# Patient Record
Sex: Female | Born: 1995 | Race: Black or African American | Hispanic: No | Marital: Single | Smoking: Never smoker
Health system: Southern US, Community
[De-identification: ages and names within clinical notes are randomized; demographics above are authoritative.]

## PROBLEM LIST (undated history)

## (undated) DIAGNOSIS — Z973 Presence of spectacles and contact lenses: Secondary | ICD-10-CM

## (undated) DIAGNOSIS — G43909 Migraine, unspecified, not intractable, without status migrainosus: Secondary | ICD-10-CM

## (undated) DIAGNOSIS — Z148 Genetic carrier of other disease: Secondary | ICD-10-CM

## (undated) DIAGNOSIS — I10 Essential (primary) hypertension: Secondary | ICD-10-CM

## (undated) HISTORY — PX: WISDOM TOOTH EXTRACTION: SHX21

## (undated) HISTORY — PX: NO PAST SURGERIES: SHX2092

---
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
## 2014-11-01 ENCOUNTER — Encounter (HOSPITAL_COMMUNITY): Payer: Self-pay | Admitting: Emergency Medicine

## 2014-11-01 ENCOUNTER — Emergency Department (HOSPITAL_COMMUNITY)
Admission: EM | Admit: 2014-11-01 | Discharge: 2014-11-02 | Disposition: A | Discharge status: Home / Self Care | Payer: BLUE CROSS/BLUE SHIELD | Attending: Emergency Medicine | Admitting: Emergency Medicine

## 2014-11-01 DIAGNOSIS — J029 Acute pharyngitis, unspecified: Secondary | ICD-10-CM | POA: Diagnosis present

## 2014-11-01 DIAGNOSIS — J020 Streptococcal pharyngitis: Secondary | ICD-10-CM | POA: Insufficient documentation

## 2014-11-01 LAB — RAPID STREP SCREEN (MED CTR MEBANE ONLY): Streptococcus, Group A Screen (Direct): POSITIVE — AB

## 2014-11-01 NOTE — ED Notes (Signed)
Pt reports sore throat and body aches since last night. Tonsils are swollen. Pt has hx strep throat. No other c/c.

## 2014-11-02 MED ORDER — AMOXICILLIN 500 MG PO CAPS: 1000.0000 mg | ORAL_CAPSULE | Freq: Two times a day (BID) | ORAL | Status: DC

## 2014-11-02 NOTE — ED Provider Notes (Signed)
CSN: [id number]     Arrival date & time 11/01/14  2300 History   First MD Initiated Contact with Patient 11/01/14 2331     Chief Complaint  Patient presents with  . Sore Throat  . Generalized Body Aches     (Consider location/radiation/quality/duration/timing/severity/associated sxs/prior Treatment) Patient is a 19 y.o. female presenting with pharyngitis. The history is provided by the patient. No language interpreter was used.  Sore Throat This is a new problem. The current episode started yesterday. The problem occurs constantly. The problem has been gradually worsening. Associated symptoms include chills, congestion, a fever and a sore throat. Pertinent negatives include no coughing, nausea, rash, vomiting or weakness.    History reviewed. No pertinent past medical history. History reviewed. No pertinent past surgical history. History reviewed. No pertinent family history. Social History  Substance Use Topics  . Smoking status: Never Smoker   . Smokeless tobacco: None  . Alcohol Use: No   OB History    No data available     Review of Systems  Constitutional: Positive for fever and chills.  HENT: Positive for congestion and sore throat. Negative for trouble swallowing.   Respiratory: Negative for cough.   Gastrointestinal: Negative for nausea and vomiting.  Skin: Negative for rash.  Neurological: Negative for weakness.      Allergies  Review of patient's allergies indicates no known allergies.  Home Medications   Prior to Admission medications   Medication Sig Start Date End Date Taking? Authorizing Provider  amoxicillin (AMOXIL) 500 MG capsule Take 2 capsules (1,000 mg total) by mouth 2 (two) times daily. 11/02/14   Shari Upstill, PA-C   BP 145/76 mmHg  Pulse 123  Temp(Src) 99.5 F (37.5 C) (Oral)  Resp 15  Ht  (1.6 m)  Wt 165 lb (74.844 kg)  BMI 29.24 kg/m2  SpO2 100%  LMP 10/11/2014 (Exact Date) Physical Exam  Constitutional: She is oriented to  person, place, and time. She appears well-developed and well-nourished.  HENT:  Head: Normocephalic.  Mouth/Throat: Oropharynx is clear and moist.  Bilateral tonsillar swelling without exudates. Uvula midline.   Neck: Normal range of motion. Neck supple.  Pulmonary/Chest: Effort normal. No respiratory distress.  Abdominal: Soft. Bowel sounds are normal. There is no tenderness. There is no rebound and no guarding.  Musculoskeletal: Normal range of motion.  Lymphadenopathy:    She has cervical adenopathy.  Neurological: She is alert and oriented to person, place, and time.  Skin: Skin is warm and dry. No rash noted.  Psychiatric: She has a normal mood and affect.    ED Course  Procedures (including critical care time) Labs Review Labs Reviewed  RAPID STREP SCREEN (NOT AT [facility]) - Abnormal; Notable for the following:    Streptococcus, Group A Screen (Direct) POSITIVE (*)    All other components within normal limits    Imaging Review No results found. I have personally reviewed and evaluated these images and lab results as part of my medical decision-making.   EKG Interpretation None      MDM   Final diagnoses:  Strep throat    Uncomplicated strep throat in well appearing patient. She prefers oral abx regimen vs bicillin IM.    Elpidio Anis, PA-C 11/02/14 8119  Devoria Albe, MD 11/02/14 [phone]

## 2014-11-02 NOTE — Discharge Instructions (Signed)
Salt Water Gargle This solution will help make your mouth and throat feel better. HOME CARE INSTRUCTIONS   Mix 1 teaspoon of salt in 8 ounces of warm water.  Gargle with this solution as much or often as you need or as directed. Swish and gargle gently if you have any sores or wounds in your mouth.  Do not swallow this mixture. Document Released: 11/02/2003 Document Revised: 04/22/2011 Document Reviewed: 03/25/2008 [facility] Patient Information 2015 [place], [place]. This information is not intended to replace advice given to you by your health care provider. Make sure you discuss any questions you have with your health care provider. Strep Throat Strep throat is an infection of the throat caused by a bacteria named Streptococcus pyogenes. Your health care provider may call the infection streptococcal "tonsillitis" or "pharyngitis" depending on whether there are signs of inflammation in the tonsils or back of the throat. Strep throat is most common in children aged 5-15 years during the cold months of the year, but it can occur in people of any age during any season. This infection is spread from person to person (contagious) through coughing, sneezing, or other close contact. SIGNS AND SYMPTOMS   Fever or chills.  Painful, swollen, red tonsils or throat.  Pain or difficulty when swallowing.  White or yellow spots on the tonsils or throat.  Swollen, tender lymph nodes or "glands" of the neck or under the jaw.  Red rash all over the body (rare). DIAGNOSIS  Many different infections can cause the same symptoms. A test must be done to confirm the diagnosis so the right treatment can be given. A "rapid strep test" can help your health care provider make the diagnosis in a few minutes. If this test is not available, a light swab of the infected area can be used for a throat culture test. If a throat culture test is done, results are usually available in a day or two. TREATMENT  Strep throat is  treated with antibiotic medicine. HOME CARE INSTRUCTIONS   Gargle with 1 tsp of salt in 1 cup of warm water, 3-4 times per day or as needed for comfort.  Family members who also have a sore throat or fever should be tested for strep throat and treated with antibiotics if they have the strep infection.  Make sure everyone in your household washes their hands well.  Do not share food, drinking cups, or personal items that could cause the infection to spread to others.  You may need to eat a soft food diet until your sore throat gets better.  Drink enough water and fluids to keep your urine clear or pale yellow. This will help prevent dehydration.  Get plenty of rest.  Stay home from school, day care, or work until you have been on antibiotics for 24 hours.  Take medicines only as directed by your health care provider.  Take your antibiotic medicine as directed by your health care provider. Finish it even if you start to feel better. SEEK MEDICAL CARE IF:   The glands in your neck continue to enlarge.  You develop a rash, cough, or earache.  You cough up green, yellow-Boomershine, or bloody sputum.  You have pain or discomfort not controlled by medicines.  Your problems seem to be getting worse rather than better.  You have a fever. SEEK IMMEDIATE MEDICAL CARE IF:   You develop any new symptoms such as vomiting, severe headache, stiff or painful neck, chest pain, shortness of breath, or trouble  swallowing. °· You develop severe throat pain, drooling, or changes in your voice. °· You develop swelling of the neck, or the skin on the neck becomes red and tender. °· You develop signs of dehydration, such as fatigue, dry mouth, and decreased urination. °· You become increasingly sleepy, or you cannot wake up completely. °MAKE SURE YOU: °· Understand these instructions. °· Will watch your condition. °· Will get help right away if you are not doing well or get worse. °Document Released:  01/26/2000 Document Revised: 06/14/2013 Document Reviewed: 03/29/2010 °ExitCare® Patient Information ©2015 [employer], LLC. This information is not intended to replace advice given to you by your health care provider. Make sure you discuss any questions you have with your health care provider. ° °

## 2016-03-13 ENCOUNTER — Encounter (HOSPITAL_COMMUNITY): Payer: Self-pay

## 2016-03-13 ENCOUNTER — Emergency Department (HOSPITAL_COMMUNITY)
Admission: EM | Admit: 2016-03-13 | Discharge: 2016-03-13 | Disposition: A | Discharge status: Home / Self Care | Payer: PRIVATE HEALTH INSURANCE | Attending: Emergency Medicine | Admitting: Emergency Medicine

## 2016-03-13 DIAGNOSIS — G43009 Migraine without aura, not intractable, without status migrainosus: Secondary | ICD-10-CM | POA: Insufficient documentation

## 2016-03-13 DIAGNOSIS — G43909 Migraine, unspecified, not intractable, without status migrainosus: Secondary | ICD-10-CM | POA: Diagnosis present

## 2016-03-13 MED ORDER — IBUPROFEN 400 MG PO TABS: 600.0000 mg | ORAL_TABLET | Freq: Once | ORAL | Status: DC

## 2016-03-13 MED ORDER — BUTALBITAL-APAP-CAFFEINE 50-325-40 MG PO TABS: 1.0000 | ORAL_TABLET | Freq: Four times a day (QID) | ORAL | 0 refills | Status: AC | PRN

## 2016-03-13 MED ORDER — METOCLOPRAMIDE HCL 10 MG PO TABS
10.0000 mg | ORAL_TABLET | Freq: Once | ORAL | Status: AC
  Administered 2016-03-13: 10 mg via ORAL
  Filled 2016-03-13: qty 1

## 2016-03-13 MED ORDER — BUTALBITAL-APAP-CAFFEINE 50-325-40 MG PO TABS
1.0000 | ORAL_TABLET | Freq: Once | ORAL | Status: AC
  Administered 2016-03-13: 1 via ORAL
  Filled 2016-03-13: qty 1

## 2016-03-13 MED ORDER — METOCLOPRAMIDE HCL 10 MG PO TABS: 10.0000 mg | ORAL_TABLET | Freq: Four times a day (QID) | ORAL | 0 refills | Status: DC | PRN

## 2016-03-13 NOTE — ED Notes (Signed)
Pt verbalized understanding of d/c instructions and has no further questions. Pt stable and NAD.  

## 2016-03-13 NOTE — ED Triage Notes (Signed)
Pt presents with onset of frontal headache that began this morning.  Pt reports h/o migraines, reports she drank caffeine that resolved pain.  She reports eating pork and beef initiate pain. +photophobia, denies nausea

## 2016-03-13 NOTE — Discharge Instructions (Signed)
If you have headaches, take tylenol or motrin.   Take fioricet for severe headaches. It does contain tylenol.   You can also try reglan if you have headaches.   See a neurologist to discuss medications to prevent migraines.   Return to ER if you have worse headaches, vomiting, fevers, weakness, numbness.

## 2016-03-13 NOTE — ED Provider Notes (Signed)
[facility] Provider Note   CSN: [id number] Arrival date & time: 03/13/16  1216   By signing my name below, I, Ashlee Burch, attest that this documentation has been prepared under the direction and in the presence of [name], MD. Electronically Signed: Soijett Burch, ED Scribe. 03/13/16. 2:10 PM.  History   Chief Complaint Chief Complaint  Patient presents with  . Migraine    HPI Ashlee Burch is a 21 y.o. female who presents to the Emergency Department complaining of intermittent, frontal migraine onset 2 days ago. Pt reports associated photophobia. She has tried tylenol, aleve, and ibuprofen with no relief of her symptoms. Pt notes that her migraine is worsened with taking a nap. Pt states that she has a hx of migraines and hasn't been evaluated by a neurologist. Pt reports that she doesn't take daily preventative medications. She denies vomiting, fever, weight loss, neck stiffness, and any other symptoms. Denies allergies to medications. Denies being pregnant.    The history is provided by the patient. No language interpreter was used.    History reviewed. No pertinent past medical history.  Patient Active Problem List   Diagnosis Date Noted  . ELEVATED BLOOD PRESSURE WITHOUT DIAGNOSIS OF HYPERTENSION 07/02/2007  . OBESITY, NOS 04/10/2006    History reviewed. No pertinent surgical history.  OB History    No data available       Home Medications    Prior to Admission medications   Medication Sig Start Date End Date Taking? Authorizing Provider  amoxicillin (AMOXIL) 500 MG capsule Take 2 capsules (1,000 mg total) by mouth 2 (two) times daily. 11/02/14   Elpidio Anis, PA-C    Family History History reviewed. No pertinent family history.  Social History Social History  Substance Use Topics  . Smoking status: Never Smoker  . Smokeless tobacco: Never Used  . Alcohol use No     Allergies   Patient has no known allergies.   Review of  Systems Review of Systems  Eyes: Positive for photophobia.  Neurological: Positive for headaches (frontal).  All other systems reviewed and are negative.    Physical Exam Updated Vital Signs BP 148/80   Pulse 99   Temp 98.5 F (36.9 C) (Oral)   Resp 18   Ht 5\' 2"  (1.575 m)   Wt 170 lb (77.1 kg)   LMP 03/06/2016 (Approximate)   SpO2 100%   BMI 31.09 kg/m   Physical Exam  Constitutional: She is oriented to person, place, and time. She appears well-developed and well-nourished. No distress.  HENT:  Head: Normocephalic and atraumatic.  Eyes: EOM are normal.  EOM intact.   Neck: Neck supple.  Cardiovascular: Normal rate, regular rhythm and normal heart sounds.  Exam reveals no gallop and no friction rub.   No murmur heard. Pulmonary/Chest: Effort normal and breath sounds normal. No respiratory distress. She has no wheezes. She has no rales.  Abdominal: She exhibits no distension.  Musculoskeletal: Normal range of motion.  Neurological: She is alert and oriented to person, place, and time. She has normal strength. Gait normal.  Cranial nerves 2-12 intact. Nl finger-to-nose. Nl gait. Strength nl.   Skin: Skin is warm and dry.  Psychiatric: She has a normal mood and affect. Her behavior is normal.  Nursing note and vitals reviewed.    ED Treatments / Results  DIAGNOSTIC STUDIES: Oxygen Saturation is 100% on RA, nl by my interpretation.    COORDINATION OF CARE: 2:08 PM Discussed treatment plan with pt  at bedside which includes fioricet, reglan, and pt agreed to plan.  Procedures Procedures (including critical care time)  Medications Ordered in ED Medications - No data to display   Initial Impression / Assessment and Plan / ED Course  I have reviewed the triage vital signs and the nursing notes.  [name] is a 21 y.o. female here with headaches. Hx of migraines and this is similar to previous migraine. Nl neuro exam currently, vitals stable. I think likely  migraines. Offered IV migraine cocktail but she just wants pills. Given reglan, fioricet and felt better. Told her to see neurologist to discuss migraine prevention meds.    Final Clinical Impressions(s) / ED Diagnoses   Final diagnoses:  None    New Prescriptions New Prescriptions   No medications on file   I personally performed the services described in this documentation, which was scribed in my presence. The recorded information has been reviewed and is accurate.     Charlynne Panderavid Hsienta Yao, MD 03/13/16 [phone]

## 2016-06-02 ENCOUNTER — Encounter (HOSPITAL_COMMUNITY): Payer: Self-pay | Admitting: Emergency Medicine

## 2016-06-02 ENCOUNTER — Ambulatory Visit (HOSPITAL_COMMUNITY)
Admission: EM | Admit: 2016-06-02 | Discharge: 2016-06-02 | Disposition: A | Discharge status: Home / Self Care | Payer: PRIVATE HEALTH INSURANCE | Attending: Family Medicine | Admitting: Family Medicine

## 2016-06-02 DIAGNOSIS — M542 Cervicalgia: Secondary | ICD-10-CM

## 2016-06-02 DIAGNOSIS — M546 Pain in thoracic spine: Secondary | ICD-10-CM

## 2016-06-02 DIAGNOSIS — S161XXA Strain of muscle, fascia and tendon at neck level, initial encounter: Secondary | ICD-10-CM

## 2016-06-02 MED ORDER — CYCLOBENZAPRINE HCL 5 MG PO TABS: 5.0000 mg | ORAL_TABLET | Freq: Every day | ORAL | 0 refills | Status: DC

## 2016-06-02 MED ORDER — NAPROXEN 250 MG PO TABS: 250.0000 mg | ORAL_TABLET | Freq: Two times a day (BID) | ORAL | 0 refills | Status: DC
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
## 2017-01-14 ENCOUNTER — Encounter (HOSPITAL_COMMUNITY): Payer: Self-pay | Admitting: Emergency Medicine

## 2017-01-14 ENCOUNTER — Ambulatory Visit (HOSPITAL_COMMUNITY)
Admission: EM | Admit: 2017-01-14 | Discharge: 2017-01-14 | Disposition: A | Discharge status: Home / Self Care | Payer: PRIVATE HEALTH INSURANCE | Attending: Internal Medicine | Admitting: Internal Medicine

## 2017-01-14 ENCOUNTER — Other Ambulatory Visit: Payer: Self-pay

## 2017-01-14 DIAGNOSIS — H66002 Acute suppurative otitis media without spontaneous rupture of ear drum, left ear: Secondary | ICD-10-CM | POA: Diagnosis not present

## 2017-01-14 MED ORDER — AMOXICILLIN 500 MG PO CAPS: 500.0000 mg | ORAL_CAPSULE | Freq: Two times a day (BID) | ORAL | 0 refills | Status: AC

## 2017-01-14 NOTE — ED Provider Notes (Signed)
[facility]    CSN: [id number] Arrival date & time: 01/14/17  1242     History   Chief Complaint Chief Complaint  Patient presents with  . Otalgia    bilateral    HPI Ashlee Burch is a 21 y.o. female.   Ashlee Burch presents with complaints of worsening ear pain since last night, left is greater than right. She states that she develop cold symptoms approximately 4 days ago with fevers, chills, cough, runny nose and sore throat. Fevers and sore throat have improved but left ear pain has worsened. Rates it 8/10. Denies gi/gu complaints. Has not taken any medications for her symptoms. 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    ROS per HPI.       History reviewed. No pertinent past medical history.  Patient Active Problem List   Diagnosis Date Noted  . ELEVATED BLOOD PRESSURE WITHOUT DIAGNOSIS OF HYPERTENSION 07/02/2007  . OBESITY, NOS 04/10/2006    History reviewed. No pertinent surgical history.  OB History    No data available       Home Medications    Prior to Admission medications   Medication Sig Start Date End Date Taking? Authorizing Provider  amoxicillin (AMOXIL) 500 MG capsule Take 1 capsule (500 mg total) by mouth 2 (two) times daily for 7 days. 01/14/17 01/21/17  Georgetta HaberBurky, Annamaria Salah B, NP  butalbital-acetaminophen-caffeine (FIORICET, ESGIC) [phone] MG tablet Take 1-2 tablets by mouth every 6 (six) hours as needed for headache. 03/13/16 03/13/17  Charlynne PanderYao, David Hsienta, MD  cyclobenzaprine (FLEXERIL) 5 MG tablet Take 1 tablet (5 mg total) by mouth at bedtime. 06/02/16   Deatra Canterxford, William J, FNP  metoCLOPramide (REGLAN) 10 MG tablet Take 1 tablet (10 mg total) by mouth every 6 (six) hours as needed for nausea (nausea/headache). 03/13/16   Charlynne PanderYao, David Hsienta, MD  naproxen (NAPROSYN) 250 MG tablet Take 1 tablet (250 mg total) by mouth 2 (two) times daily with a meal. 06/02/16   [place], Anselm PancoastWilliam J, FNP    Family History History reviewed. No  pertinent family history.  Social History Social History   Tobacco Use  . Smoking status: Never Smoker  . Smokeless tobacco: Never Used  Substance Use Topics  . Alcohol use: No  . Drug use: No     Allergies   Patient has no known allergies.   Review of Systems Review of Systems   Physical Exam Triage Vital Signs ED Triage Vitals  Enc Vitals Group     BP 01/14/17 1309 128/84     Pulse Rate 01/14/17 1309 86     Resp --      Temp 01/14/17 1309 98.7 F (37.1 C)     Temp Source 01/14/17 1309 Oral     SpO2 01/14/17 1309 100 %     Weight --      Height --      Head Circumference --      Peak Flow --      Pain Score 01/14/17 1306 8     Pain Loc --      Pain Edu? --      Excl. in GC? --    No data found.  Updated Vital Signs BP 128/84 (BP Location: Left Arm)   Pulse 86   Temp 98.7 F (37.1 C) (Oral)   LMP 01/13/2017 (Exact Date)   SpO2 100%   Visual Acuity Right Eye Distance:   Left Eye Distance:   Bilateral Distance:  Right Eye Near:   Left Eye Near:    Bilateral Near:     Physical Exam  Constitutional: She is oriented to person, place, and time. She appears well-developed and well-nourished. No distress.  HENT:  Head: Normocephalic and atraumatic.  Right Ear: Tympanic membrane, external ear and ear canal normal.  Left Ear: External ear and ear canal normal. Tympanic membrane is injected and bulging.  Nose: Nose normal.  Mouth/Throat: Uvula is midline, oropharynx is clear and moist and mucous membranes are normal. No tonsillar exudate.  Eyes: Conjunctivae and EOM are normal. Pupils are equal, round, and reactive to light.  Cardiovascular: Normal rate, regular rhythm and normal heart sounds.  Pulmonary/Chest: Effort normal and breath sounds normal.  Neurological: She is alert and oriented to person, place, and time.  Skin: Skin is warm and dry.     UC Treatments / Results  Labs (all labs ordered are listed, but only abnormal results are  displayed) Labs Reviewed - No data to display  EKG  EKG Interpretation None       Radiology No results found.  Procedures Procedures (including critical care time)  Medications Ordered in UC Medications - No data to display   Initial Impression / Assessment and Plan / UC Course  I have reviewed the triage vital signs and the nursing notes.  Pertinent labs & imaging results that were available during my care of the patient were reviewed by me and considered in my medical decision making (see chart for details).     Complete course of antibiotics. Push fluids to ensure adequate hydration and keep secretions thin. Tylenol and/or ibuprofen as needed for pain or fevers. Return precautions provided.Patient verbalized understanding and agreeable to plan.    Final Clinical Impressions(s) / UC Diagnoses   Final diagnoses:  Acute suppurative otitis media of left ear without spontaneous rupture of tympanic membrane, recurrence not specified    ED Discharge Orders        Ordered    amoxicillin (AMOXIL) 500 MG capsule  2 times daily     01/14/17 1335       Controlled Substance Prescriptions [place] Controlled Substance Registry consulted? Not Applicable   Georgetta HaberBurky, Feather Berrie B, NP 01/14/17 1339

## 2017-01-14 NOTE — ED Triage Notes (Signed)
Pt reports suffering from a cold over the weekend.  Last night she developed bilateral ear pain, with a lot more pain on the left side.  Pt denies any fever.

## 2017-01-14 NOTE — Discharge Instructions (Signed)
Push fluids to ensure adequate hydration and keep secretions thin.  Complete course of antibiotics. Tylenol and/or ibuprofen as needed for pain or fevers.  May try over the counter nasal saline, flonase or nasonex as this may also help with symptoms.

## 2017-09-30 ENCOUNTER — Encounter (HOSPITAL_COMMUNITY): Payer: Self-pay | Admitting: Emergency Medicine

## 2017-09-30 ENCOUNTER — Ambulatory Visit (HOSPITAL_COMMUNITY)
Admission: EM | Admit: 2017-09-30 | Discharge: 2017-09-30 | Disposition: A | Discharge status: Home / Self Care | Payer: PRIVATE HEALTH INSURANCE | Attending: Nurse Practitioner | Admitting: Nurse Practitioner

## 2017-09-30 DIAGNOSIS — G43909 Migraine, unspecified, not intractable, without status migrainosus: Secondary | ICD-10-CM | POA: Insufficient documentation

## 2017-09-30 DIAGNOSIS — G43009 Migraine without aura, not intractable, without status migrainosus: Secondary | ICD-10-CM | POA: Diagnosis not present

## 2017-09-30 HISTORY — DX: Migraine, unspecified, not intractable, without status migrainosus: G43.909

## 2017-09-30 MED ORDER — KETOROLAC TROMETHAMINE 60 MG/2ML IM SOLN
INTRAMUSCULAR | Status: AC
  Filled 2017-09-30: qty 2

## 2017-09-30 MED ORDER — METOCLOPRAMIDE HCL 10 MG PO TABS: 10.0000 mg | ORAL_TABLET | Freq: Three times a day (TID) | ORAL | 0 refills | Status: DC | PRN

## 2017-09-30 MED ORDER — DEXAMETHASONE SODIUM PHOSPHATE 10 MG/ML IJ SOLN
10.0000 mg | Freq: Once | INTRAMUSCULAR | Status: AC
  Administered 2017-09-30: 10 mg via INTRAMUSCULAR

## 2017-09-30 MED ORDER — METOCLOPRAMIDE HCL 5 MG/ML IJ SOLN
10.0000 mg | Freq: Once | INTRAMUSCULAR | Status: AC
  Administered 2017-09-30: 10 mg via INTRAMUSCULAR

## 2017-09-30 MED ORDER — BUTALBITAL-APAP-CAFFEINE 50-325-40 MG PO TABS: 1.0000 | ORAL_TABLET | Freq: Four times a day (QID) | ORAL | 0 refills | Status: AC | PRN

## 2017-09-30 MED ORDER — METOCLOPRAMIDE HCL 5 MG/ML IJ SOLN
INTRAMUSCULAR | Status: AC
  Filled 2017-09-30: qty 2

## 2017-09-30 MED ORDER — KETOROLAC TROMETHAMINE 60 MG/2ML IM SOLN
60.0000 mg | Freq: Once | INTRAMUSCULAR | Status: AC
  Administered 2017-09-30: 60 mg via INTRAMUSCULAR

## 2017-09-30 MED ORDER — DEXAMETHASONE SODIUM PHOSPHATE 10 MG/ML IJ SOLN
INTRAMUSCULAR | Status: AC
  Filled 2017-09-30: qty 1

## 2017-09-30 NOTE — ED Triage Notes (Signed)
Pt sts frontal HA with hx of same

## 2017-09-30 NOTE — ED Provider Notes (Signed)
[facility]    CSN: [id number] Arrival date & time: 09/30/17  1309     History   Chief Complaint Chief Complaint  Patient presents with  . Headache    HPI Ashlee Burch is a 22 y.o. female.   Subjective:  Ashlee Burch is a 22 y.o. female who presents for evaluation of headache. Symptoms began about 1 week ago. Rapidity of onset was gradual. The patient gets headaches frequently. The headache is described as mild and throbbing and is frontal in location. Precipitating factors include none which have been determined. The headache was not preceded by an aura. Associated neurologic symptoms which are present include occasional vision problems and nausea. The patient denies dizziness, loss of balance, muscle weakness, numbness of extremities and speech difficulties. Other associated symptoms include nothing pertinent.  Patient denies any neck stiffness or photophobia. Home treatment has included ibuprofen, resting and sleeping with inadequate improvement. Other history includes: migraine headaches diagnosed in the past. Family history includes migraine headaches in mother.  The following portions of the patient's history were reviewed and updated as appropriate: allergies, current medications, past family history, past medical history, past social history, past surgical history and problem list.          Past Medical History:  Diagnosis Date  . Migraine     Patient Active Problem List   Diagnosis Date Noted  . Migraine   . ELEVATED BLOOD PRESSURE WITHOUT DIAGNOSIS OF HYPERTENSION 07/02/2007  . OBESITY, NOS 04/10/2006    History reviewed. No pertinent surgical history.  OB History   None      Home Medications    Prior to Admission medications   Medication Sig Start Date End Date Taking? Authorizing Provider  butalbital-acetaminophen-caffeine (FIORICET, ESGIC) [phone] MG tablet Take 1-2 tablets by mouth every 6 (six) hours as needed for headache.  09/30/17 09/30/18  Lurline IdolMurrill, Richardson Dubree, FNP  metoCLOPramide (REGLAN) 10 MG tablet Take 1 tablet (10 mg total) by mouth every 8 (eight) hours as needed (migraine headache/ take with fioricet). 09/30/17   Lurline IdolMurrill, Yamato Kopf, FNP    Family History History reviewed. No pertinent family history.  Social History Social History   Tobacco Use  . Smoking status: Never Smoker  . Smokeless tobacco: Never Used  Substance Use Topics  . Alcohol use: No  . Drug use: No     Allergies   Patient has no known allergies.   Review of Systems Review of Systems  Constitutional: Negative for fever.  Eyes: Positive for visual disturbance. Negative for photophobia.  Gastrointestinal: Positive for nausea. Negative for vomiting.  Musculoskeletal: Negative for neck pain and neck stiffness.  Skin: Negative for rash.  Neurological: Positive for headaches. Negative for dizziness, facial asymmetry, speech difficulty, weakness and numbness.  Psychiatric/Behavioral: Negative for confusion.  All other systems reviewed and are negative.    Physical Exam Triage Vital Signs ED Triage Vitals [09/30/17 1326]  Enc Vitals Group     BP (!) 142/71     Pulse Rate 76     Resp 16     Temp 98.3 F (36.8 C)     Temp Source Oral     SpO2 100 %     Weight      Height      Head Circumference      Peak Flow      Pain Score      Pain Loc      Pain Edu?      Excl. in GC?  No data found.  Updated Vital Signs BP (!) 142/71 (BP Location: Left Arm)   Pulse 76   Temp 98.3 F (36.8 C) (Oral)   Resp 16   SpO2 100%   Visual Acuity Right Eye Distance:   Left Eye Distance:   Bilateral Distance:    Right Eye Near:   Left Eye Near:    Bilateral Near:     Physical Exam  Constitutional: She is oriented to person, place, and time. She appears well-developed and well-nourished.  Non-toxic appearance.  HENT:  Head: Normocephalic.  Neck: Normal range of motion. Neck supple. No neck rigidity.  Cardiovascular:  Normal rate and regular rhythm.  Pulmonary/Chest: Effort normal and breath sounds normal.  Musculoskeletal: Normal range of motion.  Lymphadenopathy:    She has no cervical adenopathy.  Neurological: She is alert and oriented to person, place, and time. She has normal strength and normal reflexes. No cranial nerve deficit or sensory deficit. GCS eye subscore is 4. GCS verbal subscore is 5. GCS motor subscore is 6.  Skin: Skin is warm and dry.     UC Treatments / Results  Labs (all labs ordered are listed, but only abnormal results are displayed) Labs Reviewed - No data to display  EKG None  Radiology No results found.  Procedures Procedures (including critical care time)  Medications Ordered in UC Medications  dexamethasone (DECADRON) injection 10 mg (10 mg Intramuscular Given 09/30/17 1451)  metoCLOPramide (REGLAN) injection 10 mg (10 mg Intramuscular Given 09/30/17 1451)  ketorolac (TORADOL) injection 60 mg (60 mg Intramuscular Given 09/30/17 1451)    Initial Impression / Assessment and Plan / UC Course  I have reviewed the triage vital signs and the nursing notes.  Pertinent labs & imaging results that were available during my care of the patient were reviewed by me and considered in my medical decision making (see chart for details).    22 yo female with history of migraines that presents with a one-week history of her typical migraine. Patient AAOx3. VSS. No focal neuro deficits noted on exam. Patient given Reglan 10 mg IM, decadron 10 mg IM and Toradol 60 mg IM in clinic with significant improvement in her symptoms.   Plan:  - Prescription medications: Fioricet, Reglan  - Headache diary recommended. - Importance of adequate hydration discussed. - Discussed lifestyle issues (diet, sleep, exercise). - Follow-up with Neurology if no improvement or continued frequent headaches   Today's evaluation has revealed no signs of a dangerous process. Discussed diagnosis with  patient. Patient aware of their diagnosis, possible red flag symptoms to watch out for and need for close follow up. Patient understands verbal and written discharge instructions. Patient comfortable with plan and disposition.  Patient has a clear mental status at this time, good insight into illness (after discussion and teaching) and has clear judgment to make decisions regarding their care.  Documentation was completed with the aid of voice recognition software. Transcription may contain typographical errors. Final Clinical Impressions(s) / UC Diagnoses   Final diagnoses:  Migraine without aura and without status migrainosus, not intractable   Discharge Instructions   None    ED Prescriptions    Medication Sig Dispense Auth. Provider   butalbital-acetaminophen-caffeine (FIORICET, ESGIC) 50-325-40 MG tablet Take 1-2 tablets by mouth every 6 (six) hours as needed for headache. 20 tablet Lurline IdolMurrill, Zakeria Kulzer, FNP   metoCLOPramide (REGLAN) 10 MG tablet Take 1 tablet (10 mg total) by mouth every 8 (eight) hours as needed (migraine headache/ take with  fioricet). 30 tablet Lurline Idol, FNP     Controlled Substance Prescriptions [place] Controlled Substance Registry consulted? Not Applicable   Lurline Idol, [place] 09/30/17 [phone]

## 2018-09-24 ENCOUNTER — Other Ambulatory Visit: Payer: Self-pay | Admitting: Internal Medicine

## 2018-09-24 DIAGNOSIS — Z20822 Contact with and (suspected) exposure to covid-19: Secondary | ICD-10-CM

## 2018-09-26 LAB — NOVEL CORONAVIRUS, NAA: SARS-CoV-2, NAA: NOT DETECTED

## 2018-12-14 ENCOUNTER — Emergency Department (HOSPITAL_COMMUNITY)
Admission: EM | Admit: 2018-12-14 | Discharge: 2018-12-14 | Disposition: A | Discharge status: Home / Self Care | Payer: PRIVATE HEALTH INSURANCE | Attending: Emergency Medicine | Admitting: Emergency Medicine

## 2018-12-14 ENCOUNTER — Encounter (HOSPITAL_COMMUNITY): Payer: Self-pay

## 2018-12-14 ENCOUNTER — Other Ambulatory Visit: Payer: Self-pay

## 2018-12-14 DIAGNOSIS — Z79899 Other long term (current) drug therapy: Secondary | ICD-10-CM | POA: Insufficient documentation

## 2018-12-14 DIAGNOSIS — G43909 Migraine, unspecified, not intractable, without status migrainosus: Secondary | ICD-10-CM | POA: Insufficient documentation

## 2018-12-14 MED ORDER — METOCLOPRAMIDE HCL 10 MG PO TABS: 10.0000 mg | ORAL_TABLET | Freq: Three times a day (TID) | ORAL | 0 refills | Status: DC | PRN

## 2018-12-14 MED ORDER — KETOROLAC TROMETHAMINE 60 MG/2ML IM SOLN
60.0000 mg | Freq: Once | INTRAMUSCULAR | Status: AC
  Administered 2018-12-14: 60 mg via INTRAMUSCULAR
  Filled 2018-12-14: qty 2

## 2018-12-14 NOTE — ED Provider Notes (Signed)
[facility]-EMERGENCY DEPT Provider Note   CSN: [id number] Arrival date & time: 12/14/18  7026     History   Chief Complaint Chief Complaint  Patient presents with  . Migraine    HPI Ashlee Burch is a 23 y.o. female.     23 year old female with history of migraines presents with several days of mild frontal headache.  No photophobia or phonophobia.  No fever, chills, neck pain.  Has medicated with Motrin with temporary relief.  Denies any neurological findings.  No recent rashes.  Current headache is similar to her prior.     Past Medical History:  Diagnosis Date  . Migraine     Patient Active Problem List   Diagnosis Date Noted  . Migraine   . ELEVATED BLOOD PRESSURE WITHOUT DIAGNOSIS OF HYPERTENSION 07/02/2007  . OBESITY, NOS 04/10/2006    History reviewed. No pertinent surgical history.   OB History   No obstetric history on file.      Home Medications    Prior to Admission medications   Medication Sig Start Date End Date Taking? Authorizing Provider  metoCLOPramide (REGLAN) 10 MG tablet Take 1 tablet (10 mg total) by mouth every 8 (eight) hours as needed (migraine headache/ take with fioricet). 09/30/17   Lurline Idol, FNP    Family History No family history on file.  Social History Social History   Tobacco Use  . Smoking status: Never Smoker  . Smokeless tobacco: Never Used  Substance Use Topics  . Alcohol use: No  . Drug use: No     Allergies   Patient has no known allergies.   Review of Systems Review of Systems  All other systems reviewed and are negative.    Physical Exam Updated Vital Signs BP (!) 141/106   Pulse 75   Temp 98 F (36.7 C) (Oral)   Resp 18   LMP 11/24/2018   SpO2 100%   Physical Exam Vitals signs and nursing note reviewed.  Constitutional:      General: She is not in acute distress.    Appearance: Normal appearance. She is well-developed. She is not toxic-appearing.  HENT:      Head: Normocephalic and atraumatic.  Eyes:     General: Lids are normal.     Conjunctiva/sclera: Conjunctivae normal.     Pupils: Pupils are equal, round, and reactive to light.  Neck:     Musculoskeletal: Normal range of motion and neck supple.     Thyroid: No thyroid mass.     Trachea: No tracheal deviation.  Cardiovascular:     Rate and Rhythm: Normal rate and regular rhythm.     Heart sounds: Normal heart sounds. No murmur. No gallop.   Pulmonary:     Effort: Pulmonary effort is normal. No respiratory distress.     Breath sounds: Normal breath sounds. No stridor. No decreased breath sounds, wheezing, rhonchi or rales.  Abdominal:     General: Bowel sounds are normal. There is no distension.     Palpations: Abdomen is soft.     Tenderness: There is no abdominal tenderness. There is no rebound.  Musculoskeletal: Normal range of motion.        General: No tenderness.  Skin:    General: Skin is warm and dry.     Findings: No abrasion or rash.  Neurological:     Mental Status: She is alert and oriented to person, place, and time.     GCS: GCS eye subscore is  4. GCS verbal subscore is 5. GCS motor subscore is 6.     Cranial Nerves: No cranial nerve deficit.     Sensory: No sensory deficit.     Gait: Gait normal.     Comments: Strength 5 of 5 in upper as well as lower extremities  Psychiatric:        Speech: Speech normal.        Behavior: Behavior normal.      ED Treatments / Results  Labs (all labs ordered are listed, but only abnormal results are displayed) Labs Reviewed - No data to display  EKG None  Radiology No results found.  Procedures Procedures (including critical care time)  Medications Ordered in ED Medications  ketorolac (TORADOL) injection 60 mg (has no administration in time range)     Initial Impression / Assessment and Plan / ED Course  I have reviewed the triage vital signs and the nursing notes.  Pertinent labs & imaging results that  were available during my care of the patient were reviewed by me and considered in my medical decision making (see chart for details).        Patient given Toradol for her typical migraine.  Will prescribe her usual medications that she takes in give referral to neurology  Final Clinical Impressions(s) / ED Diagnoses   Final diagnoses:  None    ED Discharge Orders    None       Lacretia Leigh, MD 12/14/18 0730

## 2018-12-14 NOTE — ED Triage Notes (Signed)
Pt arrived stating she has a migraine that started on Thursday. Reports history of same. Pt states home medications provided no relief. Declines any photosensitivity.

## 2019-10-11 ENCOUNTER — Encounter (HOSPITAL_COMMUNITY): Payer: Self-pay | Admitting: *Deleted

## 2019-10-11 ENCOUNTER — Other Ambulatory Visit: Payer: Self-pay

## 2019-10-11 ENCOUNTER — Emergency Department (HOSPITAL_COMMUNITY)
Admission: EM | Admit: 2019-10-11 | Discharge: 2019-10-12 | Disposition: A | Discharge status: Home / Self Care | Payer: Self-pay | Attending: Emergency Medicine | Admitting: Emergency Medicine

## 2019-10-11 DIAGNOSIS — N390 Urinary tract infection, site not specified: Secondary | ICD-10-CM | POA: Insufficient documentation

## 2019-10-11 DIAGNOSIS — R112 Nausea with vomiting, unspecified: Secondary | ICD-10-CM | POA: Insufficient documentation

## 2019-10-11 DIAGNOSIS — Z79899 Other long term (current) drug therapy: Secondary | ICD-10-CM | POA: Insufficient documentation

## 2019-10-11 LAB — URINALYSIS, ROUTINE W REFLEX MICROSCOPIC
Bilirubin Urine: NEGATIVE
Glucose, UA: NEGATIVE mg/dL
Ketones, ur: 80 mg/dL — AB
Nitrite: NEGATIVE
Protein, ur: 100 mg/dL — AB
Specific Gravity, Urine: 1.032 — ABNORMAL HIGH (ref 1.005–1.030)
pH: 5 (ref 5.0–8.0)

## 2019-10-11 LAB — COMPREHENSIVE METABOLIC PANEL
ALT: 17 U/L (ref 0–44)
AST: 21 U/L (ref 15–41)
Albumin: 4.4 g/dL (ref 3.5–5.0)
Alkaline Phosphatase: 69 U/L (ref 38–126)
Anion gap: 14 (ref 5–15)
BUN: 12 mg/dL (ref 6–20)
CO2: 21 mmol/L — ABNORMAL LOW (ref 22–32)
Calcium: 9.9 mg/dL (ref 8.9–10.3)
Chloride: 101 mmol/L (ref 98–111)
Creatinine, Ser: 0.92 mg/dL (ref 0.44–1.00)
GFR calc Af Amer: >60 mL/min (ref >60)
GFR calc non Af Amer: >60 mL/min (ref >60)
Glucose, Bld: 106 mg/dL — ABNORMAL HIGH (ref 70–99)
Potassium: 4 mmol/L (ref 3.5–5.1)
Sodium: 136 mmol/L (ref 135–145)
Total Bilirubin: 0.7 mg/dL (ref 0.3–1.2)
Total Protein: 8 g/dL (ref 6.5–8.1)

## 2019-10-11 LAB — CBC
HCT: 42.6 % (ref 36.0–46.0)
Hemoglobin: 14.4 g/dL (ref 12.0–15.0)
MCH: 28.7 pg (ref 26.0–34.0)
MCHC: 33.8 g/dL (ref 30.0–36.0)
MCV: 85 fL (ref 80.0–100.0)
Platelets: 360 10*3/uL (ref 150–400)
RBC: 5.01 MIL/uL (ref 3.87–5.11)
RDW: 13 % (ref 11.5–15.5)
WBC: 19.7 10*3/uL — ABNORMAL HIGH (ref 4.0–10.5)
nRBC: 0 % (ref 0.0–0.2)

## 2019-10-11 LAB — I-STAT BETA HCG BLOOD, ED (MC, WL, AP ONLY): I-stat hCG, quantitative: 7.8 m[IU]/mL — ABNORMAL HIGH (ref <5)

## 2019-10-11 LAB — LIPASE, BLOOD: Lipase: 19 U/L (ref 11–51)

## 2019-10-11 MED ORDER — SODIUM CHLORIDE 0.9 % IV SOLN
1.0000 g | Freq: Once | INTRAVENOUS | Status: AC
  Administered 2019-10-11: 1 g via INTRAVENOUS
  Filled 2019-10-11: qty 10

## 2019-10-11 MED ORDER — ONDANSETRON HCL 4 MG/2ML IJ SOLN
4.0000 mg | Freq: Once | INTRAMUSCULAR | Status: AC
  Administered 2019-10-11: 4 mg via INTRAVENOUS
  Filled 2019-10-11: qty 2

## 2019-10-11 MED ORDER — CEPHALEXIN 500 MG PO CAPS: 500.0000 mg | ORAL_CAPSULE | Freq: Two times a day (BID) | ORAL | 0 refills | Status: AC

## 2019-10-11 MED ORDER — ONDANSETRON 4 MG PO TBDP: 4.0000 mg | ORAL_TABLET | Freq: Three times a day (TID) | ORAL | 0 refills | Status: DC | PRN

## 2019-10-11 MED ORDER — ONDANSETRON 4 MG PO TBDP
4.0000 mg | ORAL_TABLET | Freq: Once | ORAL | Status: AC | PRN
  Administered 2019-10-11: 4 mg via ORAL
  Filled 2019-10-11: qty 1

## 2019-10-11 MED ORDER — SODIUM CHLORIDE 0.9 % IV BOLUS
1000.0000 mL | Freq: Once | INTRAVENOUS | Status: AC
  Administered 2019-10-11: 1000 mL via INTRAVENOUS

## 2019-10-11 NOTE — Discharge Instructions (Addendum)
Your testing is reassuring, I suspect that you have a urinary tract infection that is causing your symptoms however your exam and your vital signs are very normal.  Please take cephalexin twice a day for the next 5 days to treat for the infection, drink plenty of fluids and use Zofran every 8 hours as needed only if you are nauseated.  Seek a medical exam for increasing pain fever or vomiting

## 2019-10-11 NOTE — ED Provider Notes (Signed)
[facility] EMERGENCY DEPARTMENT Provider Note   CSN: [id number] Arrival date & time: 10/11/19  1941     History Chief Complaint  Patient presents with  . Emesis    Ashlee Burch is a 24 y.o. female.  HPI   This patient is a 24 year old female, she has no significant medical problems in the past, she presents to the hospital today with 1 day of feeling nausea, vomiting up some clear mucus and feeling dehydrated.  She has no abdominal pain, no urinary symptoms, no diarrhea, no back pain, no fevers or chills.  Symptoms are persistent, mild to moderate, she did get better with nausea medicine on arrival.  She thinks it might be food poisoning but has not had anybody else around her who has been sick.  She has never had a urinary infection, she does not think she is pregnant.  Past Medical History:  Diagnosis Date  . Migraine     Patient Active Problem List   Diagnosis Date Noted  . Migraine   . ELEVATED BLOOD PRESSURE WITHOUT DIAGNOSIS OF HYPERTENSION 07/02/2007  . OBESITY, NOS 04/10/2006    History reviewed. No pertinent surgical history.   OB History   No obstetric history on file.     History reviewed. No pertinent family history.  Social History   Tobacco Use  . Smoking status: Never Smoker  . Smokeless tobacco: Never Used  Substance Use Topics  . Alcohol use: No  . Drug use: No    Home Medications Prior to Admission medications   Medication Sig Start Date End Date Taking? Authorizing Provider  cephALEXin (KEFLEX) 500 MG capsule Take 1 capsule (500 mg total) by mouth 2 (two) times daily for 5 days. 10/11/19 10/16/19  Eber Hong, MD  metoCLOPramide (REGLAN) 10 MG tablet Take 1 tablet (10 mg total) by mouth every 8 (eight) hours as needed (migraine headache/ take with fioricet). 12/14/18   Lorre Nick, MD  ondansetron (ZOFRAN ODT) 4 MG disintegrating tablet Take 1 tablet (4 mg total) by mouth every 8 (eight) hours as needed for nausea or  vomiting. 10/11/19   Eber Hong, MD    Allergies    Patient has no known allergies.  Review of Systems   Review of Systems  All other systems reviewed and are negative.   Physical Exam Updated Vital Signs BP 129/90 (BP Location: Right Arm)   Pulse 70   Temp 98.5 F (36.9 C) (Oral)   Resp 16   Wt 79.4 kg   LMP 09/28/2019   SpO2 98%   BMI 32.01 kg/m   Physical Exam Vitals and nursing note reviewed.  Constitutional:      General: She is not in acute distress.    Appearance: She is well-developed.  HENT:     Head: Normocephalic and atraumatic.     Mouth/Throat:     Pharynx: No oropharyngeal exudate.  Eyes:     General: No scleral icterus.       Right eye: No discharge.        Left eye: No discharge.     Conjunctiva/sclera: Conjunctivae normal.     Pupils: Pupils are equal, round, and reactive to light.  Neck:     Thyroid: No thyromegaly.     Vascular: No JVD.  Cardiovascular:     Rate and Rhythm: Normal rate and regular rhythm.     Heart sounds: Normal heart sounds. No murmur heard.  No friction rub. No gallop.   Pulmonary:  Effort: Pulmonary effort is normal. No respiratory distress.     Breath sounds: Normal breath sounds. No wheezing or rales.  Abdominal:     General: Bowel sounds are normal. There is no distension.     Palpations: Abdomen is soft. There is no mass.     Tenderness: There is no abdominal tenderness.  Musculoskeletal:        General: No tenderness. Normal range of motion.     Cervical back: Normal range of motion and neck supple.  Lymphadenopathy:     Cervical: No cervical adenopathy.  Skin:    General: Skin is warm and dry.     Findings: No erythema or rash.  Neurological:     Mental Status: She is alert.     Coordination: Coordination normal.  Psychiatric:        Behavior: Behavior normal.     ED Results / Procedures / Treatments   Labs (all labs ordered are listed, but only abnormal results are displayed) Labs Reviewed    COMPREHENSIVE METABOLIC PANEL - Abnormal; Notable for the following components:      Result Value   CO2 21 (*)    Glucose, Bld 106 (*)    All other components within normal limits  CBC - Abnormal; Notable for the following components:   WBC 19.7 (*)    All other components within normal limits  URINALYSIS, ROUTINE W REFLEX MICROSCOPIC - Abnormal; Notable for the following components:   Color, Urine AMBER (*)    APPearance TURBID (*)    Specific Gravity, Urine 1.032 (*)    Hgb urine dipstick SMALL (*)    Ketones, ur 80 (*)    Protein, ur 100 (*)    Leukocytes,Ua MODERATE (*)    Bacteria, UA MANY (*)    All other components within normal limits  I-STAT BETA HCG BLOOD, ED ([facility], [facility], [facility] ONLY) - Abnormal; Notable for the following components:   I-stat hCG, quantitative 7.8 (*)    All other components within normal limits  URINE CULTURE  LIPASE, BLOOD    EKG None  Radiology No results found.  Procedures Procedures (including critical care time)  Medications Ordered in ED Medications  cefTRIAXone (ROCEPHIN) 1 g in sodium chloride 0.9 % 100 mL IVPB (has no administration in time range)  sodium chloride 0.9 % bolus 1,000 mL (has no administration in time range)  ondansetron (ZOFRAN) injection 4 mg (has no administration in time range)  ondansetron (ZOFRAN-ODT) disintegrating tablet 4 mg (4 mg Oral Given 10/11/19 2021)    ED Course  I have reviewed the triage vital signs and the nursing notes.  Pertinent labs & imaging results that were available during my care of the patient were reviewed by me and considered in my medical decision making (see chart for details).    MDM Rules/Calculators/A&P                          No Abd ttp - UTI - UA shows bacteria and WBC, ketones in the urine - no glucose, no vaginal sx -   Likely UTI  Rocephin, Fluids, Zofran  Stable for d/c  Pt agreeable.  Final Clinical Impression(s) / ED Diagnoses Final diagnoses:  Urinary tract  infection without hematuria, site unspecified  Non-intractable vomiting with nausea, unspecified vomiting type    Rx / DC Orders ED Discharge Orders         Ordered    ondansetron (ZOFRAN ODT) 4  MG disintegrating tablet  Every 8 hours PRN        10/11/19 2309    cephALEXin (KEFLEX) 500 MG capsule  2 times daily        10/11/19 2309           Eber Hong, MD 10/11/19 2310

## 2019-10-11 NOTE — ED Triage Notes (Signed)
Pt reports possible food poisoning, having n/v since this morning. Denies diarrhea.

## 2019-10-12 NOTE — ED Notes (Signed)
Verbalized understanding of DC instructions, Rx, follow up care 

## 2019-10-13 LAB — URINE CULTURE: Culture: 60000 — AB

## 2020-05-24 ENCOUNTER — Ambulatory Visit (INDEPENDENT_AMBULATORY_CARE_PROVIDER_SITE_OTHER): Payer: Self-pay

## 2020-05-24 VITALS — BP 127/86 | HR 71 | Wt 168.0 lb

## 2020-05-24 DIAGNOSIS — Z3201 Encounter for pregnancy test, result positive: Secondary | ICD-10-CM

## 2020-05-24 LAB — POCT PREGNANCY, URINE: Preg Test, Ur: POSITIVE — AB

## 2020-05-24 NOTE — Patient Instructions (Signed)

## 2020-05-24 NOTE — Progress Notes (Signed)
Pt here today for pregnancy test resulting positive.  Pt reports LMP 04/13/20, 5w 6d today, and EDD 01/18/21.  Medications/allergies reviewed.  Pt denies vaginal bleeding or pain.  Pt encouraged to start taking PNV.  Pt reports that she has migraines and wants to know what to take for it.  I encouraged pt to take Tylenol 500 mg take two tablets every 8 hours as needed until she is seen by a OB provider.  BP 142/77 Pt denies headache or blurry vision.  Pt denies hx HTN.  Pt also repositioning on exam table while blood pressure is taking.  BP recheck 127/86.  Pt advised to start OB care.  Pt verbalized understanding with no further questions.  Leonette Nutting  05/24/20

## 2020-05-25 NOTE — Progress Notes (Signed)
Chart reviewed for nurse visit. Agree with plan of care.   Venora Maples, MD 05/25/20 10:25 AM

## 2020-06-12 ENCOUNTER — Telehealth: Payer: Self-pay | Admitting: General Practice

## 2020-06-12 DIAGNOSIS — O219 Vomiting of pregnancy, unspecified: Secondary | ICD-10-CM

## 2020-06-12 MED ORDER — PROMETHAZINE HCL 25 MG PO TABS: 25.0000 mg | ORAL_TABLET | Freq: Four times a day (QID) | ORAL | 0 refills | Status: DC | PRN

## 2020-06-12 NOTE — Telephone Encounter (Signed)
Patient called and left message on nurse voicemail line stating she needs a prescription for nausea.  Called patient and discussed phenergan Rx per protocol sent to pharmacy. Patient verbalized understanding.

## 2020-06-26 ENCOUNTER — Other Ambulatory Visit: Payer: Self-pay

## 2020-06-26 ENCOUNTER — Ambulatory Visit (INDEPENDENT_AMBULATORY_CARE_PROVIDER_SITE_OTHER): Payer: Medicaid Other | Admitting: Obstetrics and Gynecology

## 2020-06-26 ENCOUNTER — Other Ambulatory Visit (HOSPITAL_COMMUNITY)
Admission: RE | Admit: 2020-06-26 | Discharge: 2020-06-26 | Disposition: A | Discharge status: Home / Self Care | Payer: Medicaid Other | Source: Ambulatory Visit | Attending: Obstetrics and Gynecology | Admitting: Obstetrics and Gynecology

## 2020-06-26 ENCOUNTER — Encounter: Payer: Self-pay | Admitting: Obstetrics and Gynecology

## 2020-06-26 VITALS — BP 148/86 | HR 105 | Wt 163.1 lb

## 2020-06-26 DIAGNOSIS — O10919 Unspecified pre-existing hypertension complicating pregnancy, unspecified trimester: Secondary | ICD-10-CM | POA: Diagnosis not present

## 2020-06-26 DIAGNOSIS — O0990 Supervision of high risk pregnancy, unspecified, unspecified trimester: Secondary | ICD-10-CM

## 2020-06-26 HISTORY — DX: Unspecified pre-existing hypertension complicating pregnancy, unspecified trimester: O10.919

## 2020-06-26 LAB — POCT URINALYSIS DIP (DEVICE)
Bilirubin Urine: NEGATIVE
Glucose, UA: NEGATIVE mg/dL
Hgb urine dipstick: NEGATIVE
Nitrite: NEGATIVE
Protein, ur: NEGATIVE mg/dL
Specific Gravity, Urine: >=1.03 (ref 1.005–1.030)
Urobilinogen, UA: 0.2 mg/dL (ref 0.0–1.0)
pH: 5 (ref 5.0–8.0)

## 2020-06-26 MED ORDER — ASPIRIN EC 81 MG PO TBEC: 81.0000 mg | DELAYED_RELEASE_TABLET | Freq: Every day | ORAL | 11 refills | Status: DC

## 2020-06-26 MED ORDER — BLOOD PRESSURE KIT DEVI: 1.0000 | 0 refills | Status: DC | PRN

## 2020-06-26 NOTE — Progress Notes (Signed)
Pregnancy Screening Risk Form completed. [employer] completed.  

## 2020-06-26 NOTE — Progress Notes (Signed)
History:   Ashlee Burch is a 25 y.o. G1P0 at [redacted]w[redacted]d by LMP being seen today for her first obstetrical visit.  Her obstetrical history is significant for obesity and HTN. Patient does intend to breast feed. Pregnancy history fully reviewed. FOB and mom supportive.   Hx of migraines: Takes reglan as needed. Has not had problems with migraines since pregnancy.   Strong family history of high BP.   Patient reports no complaints.    HISTORY: OB History  Gravida Para Term Preterm AB Living  1 0 0 0 0 0  SAB IAB Ectopic Multiple Live Births  0 0 0 0 0    # Outcome Date GA Lbr Len/2nd Weight Sex Delivery Anes PTL Lv  1 Current             Last pap smear was done never had a pap smear.   Past Medical History:  Diagnosis Date  . Migraine    History reviewed. No pertinent surgical history. Family History  Problem Relation Age of Onset  . Hypertension Mother    Social History   Tobacco Use  . Smoking status: Never Smoker  . Smokeless tobacco: Never Used  Vaping Use  . Vaping Use: Never used  Substance Use Topics  . Alcohol use: No  . Drug use: No   No Known Allergies Current Outpatient Medications on File Prior to Visit  Medication Sig Dispense Refill  . promethazine (PHENERGAN) 25 MG tablet Take 1 tablet (25 mg total) by mouth every 6 (six) hours as needed for nausea or vomiting. 30 tablet 0   No current facility-administered medications on file prior to visit.    Review of Systems Pertinent items noted in HPI and remainder of comprehensive ROS otherwise negative.  Physical Exam:   Vitals:   06/26/20 1419  BP: (!) 148/86  Pulse: (!) 105  Weight: 163 lb 1.6 oz (74 kg)   Fetal Heart Rate (bpm): 170  BP (!) 148/86   Pulse (!) 105   Wt 163 lb 1.6 oz (74 kg)   LMP 04/13/2020   BMI 29.83 kg/m  Uterine Size: size equals dates  Pelvic Exam:    Perineum: No Hemorrhoids, Normal Perineum   Vulva: normal   Vagina:  normal mucosa, normal discharge, no palpable  nodules   pH: Not done   Cervix: no bleeding following Pap, no cervical motion tenderness and no lesions   Adnexa: normal adnexa and no mass, fullness, tenderness   Bony Pelvis: Adequate  System: Breast:  No nipple retraction or dimpling, No nipple discharge or bleeding, No axillary or supraclavicular adenopathy, Normal to palpation without dominant masses   Skin: normal coloration and turgor, no rashes    Neurologic: negative   Extremities: normal strength, tone, and muscle mass   HEENT neck supple with midline trachea and thyroid without masses   Mouth/Teeth mucous membranes moist, pharynx normal without lesions   Neck supple and no masses   Cardiovascular: regular rate and rhythm, no murmurs or gallops   Respiratory:  appears well, vitals normal, no respiratory distress, acyanotic, normal RR, neck free of mass or lymphadenopathy, chest clear, no wheezing, crepitations, rhonchi, normal symmetric air entry   Abdomen: soft, non-tender; bowel sounds normal; no masses,  no organomegaly   Urinary: urethral meatus normal     Assessment:    Pregnancy: G1P0 Patient Active Problem List   Diagnosis Date Noted  . Chronic hypertension affecting pregnancy 06/26/2020  . Supervision of high risk pregnancy,  antepartum 06/26/2020  . Migraine   . OBESITY, NOS 04/10/2006     Plan:   1. Chronic hypertension affecting pregnancy  Baseline labs today Start BASA at 12 weeks Home BP cuff given.   2. Supervision of high risk pregnancy, antepartum  MFM [place] scheduled for 08/24/20   Welcomed to practice.  Initial labs drawn. Continue prenatal vitamins. Problem list reviewed and updated. Genetic Screening discussed, NIPS: ordered. Ultrasound discussed; fetal anatomic survey: ordered. Anticipatory guidance about prenatal visits given including labs, ultrasounds, and testing. Discussed usage of Babyscripts and virtual visits as additional source of managing and completing prenatal visits in midst  of coronavirus and pandemic.   Encouraged to complete MyChart Registration for her ability to review results, send requests, and have questions addressed.  The nature of [place] [facility] with multiple MDs and Advanced Practice Providers was explained to patient; also emphasized that residents, students are part of our team. Routine obstetric precautions reviewed. Encouraged to seek out care at office or emergency room [facility] preferred) for urgent and/or emergent concerns. No follow-ups on file.    Joyanna Kleman, Harolyn Rutherford, NP  [facility] [employer], [facility]

## 2020-06-27 LAB — CERVICOVAGINAL ANCILLARY ONLY
Bacterial Vaginitis (gardnerella): NEGATIVE
Candida Glabrata: NEGATIVE
Candida Vaginitis: POSITIVE — AB
Chlamydia: NEGATIVE
Comment: NEGATIVE
Comment: NEGATIVE
Comment: NEGATIVE
Comment: NEGATIVE
Comment: NEGATIVE
Comment: NORMAL
Neisseria Gonorrhea: NEGATIVE
Trichomonas: NEGATIVE

## 2020-06-27 LAB — PROTEIN / CREATININE RATIO, URINE
Creatinine, Urine: 381 mg/dL
Protein, Ur: 30 mg/dL
Protein/Creat Ratio: 79 mg/g creat (ref 0–200)

## 2020-06-28 ENCOUNTER — Other Ambulatory Visit: Payer: Self-pay | Admitting: Obstetrics and Gynecology

## 2020-06-28 ENCOUNTER — Other Ambulatory Visit: Payer: Self-pay

## 2020-06-28 ENCOUNTER — Encounter (HOSPITAL_COMMUNITY): Payer: Self-pay | Admitting: Obstetrics & Gynecology

## 2020-06-28 ENCOUNTER — Inpatient Hospital Stay (HOSPITAL_COMMUNITY)
Admission: AD | Admit: 2020-06-28 | Discharge: 2020-06-28 | Disposition: A | Discharge status: Home / Self Care | Payer: Medicaid Other | Attending: Obstetrics & Gynecology | Admitting: Obstetrics & Gynecology

## 2020-06-28 DIAGNOSIS — O10919 Unspecified pre-existing hypertension complicating pregnancy, unspecified trimester: Secondary | ICD-10-CM

## 2020-06-28 DIAGNOSIS — O99351 Diseases of the nervous system complicating pregnancy, first trimester: Secondary | ICD-10-CM

## 2020-06-28 DIAGNOSIS — G43009 Migraine without aura, not intractable, without status migrainosus: Secondary | ICD-10-CM

## 2020-06-28 DIAGNOSIS — Z3A10 10 weeks gestation of pregnancy: Secondary | ICD-10-CM | POA: Diagnosis not present

## 2020-06-28 DIAGNOSIS — O10911 Unspecified pre-existing hypertension complicating pregnancy, first trimester: Secondary | ICD-10-CM | POA: Diagnosis not present

## 2020-06-28 DIAGNOSIS — O10011 Pre-existing essential hypertension complicating pregnancy, first trimester: Secondary | ICD-10-CM | POA: Diagnosis not present

## 2020-06-28 LAB — RUBELLA SCREEN: Rubella Antibodies, IGG: 4.24 index (ref >0.99)

## 2020-06-28 LAB — URINALYSIS, ROUTINE W REFLEX MICROSCOPIC
Bilirubin Urine: NEGATIVE
Glucose, UA: NEGATIVE mg/dL
Hgb urine dipstick: NEGATIVE
Ketones, ur: NEGATIVE mg/dL
Leukocytes,Ua: NEGATIVE
Nitrite: NEGATIVE
Protein, ur: NEGATIVE mg/dL
Specific Gravity, Urine: 1.013 (ref 1.005–1.030)
pH: 6 (ref 5.0–8.0)

## 2020-06-28 LAB — HEPATITIS B SURFACE ANTIGEN: Hepatitis B Surface Ag: NEGATIVE

## 2020-06-28 LAB — CBC
Hematocrit: 40.9 % (ref 34.0–46.6)
Hemoglobin: 13.9 g/dL (ref 11.1–15.9)
MCH: 28.7 pg (ref 26.6–33.0)
MCHC: 34 g/dL (ref 31.5–35.7)
MCV: 85 fL (ref 79–97)
Platelets: 325 10*3/uL (ref 150–450)
RBC: 4.84 x10E6/uL (ref 3.77–5.28)
RDW: 13.7 % (ref 11.7–15.4)
WBC: 14.6 10*3/uL — ABNORMAL HIGH (ref 3.4–10.8)

## 2020-06-28 LAB — HIV ANTIBODY (ROUTINE TESTING W REFLEX): HIV Screen 4th Generation wRfx: NONREACTIVE

## 2020-06-28 LAB — HEMOGLOBIN A1C
Est. average glucose Bld gHb Est-mCnc: 114 mg/dL
Hgb A1c MFr Bld: 5.6 % (ref 4.8–5.6)

## 2020-06-28 LAB — HEPATITIS C ANTIBODY: Hep C Virus Ab: <0.1 s/co ratio (ref 0.0–0.9)

## 2020-06-28 LAB — RPR: RPR Ser Ql: NONREACTIVE

## 2020-06-28 MED ORDER — TERCONAZOLE 0.4 % VA CREA: 1.0000 | TOPICAL_CREAM | Freq: Every day | VAGINAL | 0 refills | Status: DC

## 2020-06-28 MED ORDER — CYCLOBENZAPRINE HCL 5 MG PO TABS: 5.0000 mg | ORAL_TABLET | Freq: Three times a day (TID) | ORAL | 0 refills | Status: DC | PRN

## 2020-06-28 MED ORDER — ACETAMINOPHEN 500 MG PO TABS
1000.0000 mg | ORAL_TABLET | Freq: Once | ORAL | Status: AC
  Administered 2020-06-28: 1000 mg via ORAL
  Filled 2020-06-28: qty 2

## 2020-06-28 MED ORDER — METOCLOPRAMIDE HCL 10 MG PO TABS: 10.0000 mg | ORAL_TABLET | Freq: Three times a day (TID) | ORAL | 0 refills | Status: DC | PRN

## 2020-06-28 MED ORDER — METOCLOPRAMIDE HCL 10 MG PO TABS
10.0000 mg | ORAL_TABLET | Freq: Once | ORAL | Status: AC
  Administered 2020-06-28: 10 mg via ORAL
  Filled 2020-06-28: qty 1

## 2020-06-28 NOTE — MAU Provider Note (Signed)
History     CSN: [id number]  Arrival date and time: 06/28/20 1107   Event Date/Time   First Provider Initiated Contact with Patient 06/28/20 1232      Chief Complaint  Patient presents with  . Headache   HPI Ashlee Burch is a 25 y.o. G1P0 at [redacted]w[redacted]d who presents with headache and hypertension. Reports chronic hypertension that she believes she has had since 2013 but wasn't diagnosed until this week at her new ob. Has never taken antihypertensives. Picked up her BP cuff yesterday and since then has recorded elevated BPs at home (150s-160s/90s-100s). She's unsure if her BP cuff is accurate.  Reports headache today. Frontal throbbing headache that she rates 7/10. Hasn't treated. Nothing makes better or worse. Similar to previous headaches. In the past was taking reglan & "another medication" to treat her headaches. Denies any other symptoms. No abdominal pain or vaginal bleeding.   OB History    Gravida  1   Para      Term      Preterm      AB      Living        SAB      IAB      Ectopic      Multiple      Live Births              Past Medical History:  Diagnosis Date  . Migraine     History reviewed. No pertinent surgical history.  Family History  Problem Relation Age of Onset  . Hypertension Mother     Social History   Tobacco Use  . Smoking status: Never Smoker  . Smokeless tobacco: Never Used  Vaping Use  . Vaping Use: Never used  Substance Use Topics  . Alcohol use: No  . Drug use: No    Allergies: No Known Allergies  No medications prior to admission.    Review of Systems  Constitutional: Negative.   Gastrointestinal: Negative.   Genitourinary: Negative.   Neurological: Positive for headaches.   Physical Exam   Blood pressure 129/73, pulse 76, temperature 98 F (36.7 C), temperature source Oral, resp. rate 16, height 5\' 4"  (1.626 m), weight 73.2 kg, last menstrual period 04/13/2020, SpO2 99 %.  Physical Exam Vitals and  nursing note reviewed.  Constitutional:      General: She is not in acute distress.    Appearance: She is well-developed.  HENT:     Head: Normocephalic and atraumatic.  Cardiovascular:     Rate and Rhythm: Normal rate and regular rhythm.  Pulmonary:     Effort: Pulmonary effort is normal. No respiratory distress.  Skin:    General: Skin is warm and dry.  Neurological:     Mental Status: She is alert.     Cranial Nerves: No facial asymmetry.     Motor: No weakness, abnormal muscle tone or seizure activity.  Psychiatric:        Mood and Affect: Mood normal.        Behavior: Behavior normal.     MAU Course  Procedures Results for orders placed or performed during the hospital encounter of 06/28/20 (from the past 24 hour(s))  Urinalysis, Routine w reflex microscopic     Status: None   Collection Time: 06/28/20 11:55 AM  Result Value Ref Range   Color, Urine YELLOW YELLOW   APPearance CLEAR CLEAR   Specific Gravity, Urine 1.013 1.005 - 1.030   pH 6.0 5.0 - 8.0  Glucose, UA NEGATIVE NEGATIVE mg/dL   Hgb urine dipstick NEGATIVE NEGATIVE   Bilirubin Urine NEGATIVE NEGATIVE   Ketones, ur NEGATIVE NEGATIVE mg/dL   Protein, ur NEGATIVE NEGATIVE mg/dL   Nitrite NEGATIVE NEGATIVE   Leukocytes,Ua NEGATIVE NEGATIVE    MDM Patient recently diagnosed with chronic hypertension - not on antihypertensives. Just started checking her BP at home - have been in severe range at home. Presents to MAU for hypertension & headache.  Headache is consistent with previous migraines. Treated in MAU with tylenol & reglan with complete resolution of pain.  Patient is normotensive in MAU. Concerned that home cuff may be inaccurate. Will schedule for BP check in the office tomorrow - patient to take her cuff in for comparison.   Assessment and Plan   1. Migraine without aura and without status migrainosus, not intractable  -Rx reglan & flexeril to take with tylenol for future headaches  2. Chronic  hypertension affecting pregnancy  -scheduled for BP check at [facility] tomorrow  -pt to take BP cuff with her to the office -reviewed reasons to return to MAU, esp related to hypertension  3. [redacted] weeks gestation of pregnancy      Judeth Horn 06/28/2020, 4:15 PM

## 2020-06-28 NOTE — MAU Note (Signed)
Had appt on Mon, was told she had high BP.  Pt states felt this was a problem since 2013.  Now started on medication on baby ASA- has not started on that yet. 160/103 at home this morning.repeat 151/96.  Felt  A little light headed and dizzy.  Does have a headache, has strong hx of HTN and lots of HA herself- felt it was related.  Did not take anything for HA

## 2020-06-28 NOTE — Discharge Instructions (Signed)
For prevention of migraines in pregnancy: -Magnesium, 400mg by mouth, once daily -Vitamin B2, 400mg by mouth, once daily  For treatment of migraines in pregnancy: -take medication at the first sign of the pain of a headache, or the first sign of your aura -start with 1000mg Tylenol (do not exceed 4000mg of Tylenol in 24hrs), with or without Reglan 10mg -if no relief after 1-2hours, can take Flexeril 10mg     Hypertension During Pregnancy High blood pressure (hypertension) is when the force of blood pumping through the arteries is high enough to cause problems with your health. Arteries are blood vessels that carry blood from the heart throughout the body. Hypertension during pregnancy can cause problems for you and your baby. It can be mild or severe. There are different types of hypertension that can happen during pregnancy. These include:  Chronic hypertension. This happens when you had high blood pressure before you became pregnant, and it continues during the pregnancy. Hypertension that develops before you are [redacted] weeks pregnant and continues during the pregnancy is also called chronic hypertension. If you have chronic hypertension, it will not go away after you have your baby. You will need follow-up visits with your health care provider after you have your baby. Your health care provider may want you to keep taking medicine for your blood pressure.  Gestational hypertension. This is hypertension that develops after the 20th week of pregnancy. Gestational hypertension usually goes away after you have your baby, but your health care provider will need to monitor your blood pressure to make sure that it is getting better.  Postpartum hypertension. This is high blood pressure that was present before delivery and continues after delivery or that starts after delivery. This usually occurs within 48 hours after childbirth but may occur up to 6 weeks after giving birth. When hypertension during  pregnancy is severe, it is a medical emergency that requires treatment right away. How does this affect me? Women who have hypertension during pregnancy have a greater chance of developing hypertension later in life or during future pregnancies. In some cases, hypertension during pregnancy can cause serious complications, such as:  Stroke.  Heart attack.  Injury to other organs, such as kidneys, lungs, or liver.  Preeclampsia.  A condition called hemolysis, elevated liver enzymes, and low platelet count (HELLP) syndrome.  Convulsions or seizures.  Placental abruption. How does this affect my baby? Hypertension during pregnancy can affect your baby. Your baby may:  Be born early (prematurely).  Not weigh as much as he or she should at birth (low birth weight).  Not tolerate labor well, leading to an unplanned cesarean delivery. This condition may also result in a baby's death before birth (stillbirth). What are the risks? There are certain factors that make it more likely for you to develop hypertension during pregnancy. These include:  Having hypertension during a previous pregnancy or a family history of hypertension.  Being overweight.  Being age 35 or older.  Being pregnant for the first time.  Being pregnant with more than one baby.  Becoming pregnant using fertilization methods, such as IVF (in vitro fertilization).  Having other medical problems, such as diabetes, kidney disease, or lupus. What can I do to lower my risk? The exact cause of hypertension during pregnancy is not known. You may be able to lower your risk by:  Maintaining a healthy weight.  Eating a healthy and balanced diet.  Following your health care provider's instructions about treating any long-term conditions that   you had before becoming pregnant. It is very important to keep all of your prenatal care appointments. Your health care provider will check your blood pressure and make sure that  your pregnancy is progressing as expected. If a problem is found, early treatment can prevent complications.   How is this treated? Treatment for hypertension during pregnancy varies depending on the type of hypertension you have and how serious it is.  If you were taking medicine for high blood pressure before you became pregnant, talk with your health care provider. You may need to change medicine during pregnancy because some medicines, like ACE inhibitors, may not be considered safe for your baby.  If you have gestational hypertension, your health care provider may order medicine to treat this during pregnancy.  If you are at risk for preeclampsia, your health care provider may recommend that you take a low-dose aspirin during your pregnancy.  If you have severe hypertension, you may need to be hospitalized so you and your baby can be monitored closely. You may also need to be given medicine to lower your blood pressure.  In some cases, if your condition gets worse, you may need to deliver your baby early. Follow these instructions at home: Eating and drinking  Drink enough fluid to keep your urine pale yellow.  Avoid caffeine.   Lifestyle  Do not use any products that contain nicotine or tobacco. These products include cigarettes, chewing tobacco, and vaping devices, such as e-cigarettes. If you need help quitting, ask your health care provider.  Do not use alcohol or drugs.  Avoid stress as much as possible.  Rest and get plenty of sleep.  Regular exercise can help to reduce your blood pressure. Ask your health care provider what kinds of exercise are best for you. General instructions  Take over-the-counter and prescription medicines only as told by your health care provider.  Keep all prenatal and follow-up visits. This is important. Contact a health care provider if:  You have symptoms that your health care provider told you may require more treatment or monitoring, such  as: ? Headaches. ? Nausea or vomiting. ? Abdominal pain. ? Dizziness. ? Light-headedness. Get help right away if:  You have symptoms of serious complications, such as: ? Severe abdominal pain that does not get better with treatment. ? A severe headache that does not get better, blurred vision, or double vision. ? Vomiting that does not get better. ? Sudden, rapid weight gain or swelling in your hands, ankles, or face. ? Vaginal bleeding. ? Blood in your urine. ? Shortness of breath or chest pain. ? Weakness on one side of your body or difficulty speaking.  Your baby is not moving as much as usual. These symptoms may represent a serious problem that is an emergency. Do not wait to see if the symptoms will go away. Get medical help right away. Call your local emergency services (911 in the [place] Do not drive yourself to the hospital. Summary  Hypertension during pregnancy can cause problems for you and your baby.  Treatment for hypertension during pregnancy varies depending on the type of hypertension you have and how serious it is.  Keep all prenatal and follow-up visits. This is important.  Get help right away if you have symptoms of serious complications related to high blood pressure. This information is not intended to replace advice given to you by your health care provider. Make sure you discuss any questions you have with your health care provider. Document   Revised: 10/21/2019 Document Reviewed: 10/21/2019 Elsevier Patient Education  2021 Elsevier Inc.   

## 2020-06-29 ENCOUNTER — Ambulatory Visit (INDEPENDENT_AMBULATORY_CARE_PROVIDER_SITE_OTHER): Payer: Medicaid Other | Admitting: *Deleted

## 2020-06-29 VITALS — BP 148/91 | HR 92 | Ht 64.0 in | Wt 159.1 lb

## 2020-06-29 DIAGNOSIS — O219 Vomiting of pregnancy, unspecified: Secondary | ICD-10-CM

## 2020-06-29 DIAGNOSIS — O10919 Unspecified pre-existing hypertension complicating pregnancy, unspecified trimester: Secondary | ICD-10-CM

## 2020-06-29 DIAGNOSIS — O0990 Supervision of high risk pregnancy, unspecified, unspecified trimester: Secondary | ICD-10-CM

## 2020-06-29 MED ORDER — CYCLOBENZAPRINE HCL 5 MG PO TABS: 5.0000 mg | ORAL_TABLET | Freq: Three times a day (TID) | ORAL | 0 refills | Status: DC | PRN

## 2020-06-29 MED ORDER — LABETALOL HCL 100 MG PO TABS
100.0000 mg | ORAL_TABLET | Freq: Two times a day (BID) | ORAL | 0 refills | Status: DC
Start: 2020-06-29 — End: 2020-08-23

## 2020-06-29 MED ORDER — ASPIRIN EC 81 MG PO TBEC: 81.0000 mg | DELAYED_RELEASE_TABLET | Freq: Every day | ORAL | 11 refills | Status: DC

## 2020-06-29 MED ORDER — PROMETHAZINE HCL 25 MG PO TABS: 25.0000 mg | ORAL_TABLET | Freq: Four times a day (QID) | ORAL | 0 refills | Status: DC | PRN

## 2020-06-29 MED ORDER — METOCLOPRAMIDE HCL 10 MG PO TABS: 10.0000 mg | ORAL_TABLET | Freq: Three times a day (TID) | ORAL | 0 refills | Status: DC | PRN

## 2020-06-29 NOTE — Progress Notes (Signed)
Here for nurse visit to check if her bp machine is reading correctly . Has had very high readings at home , yesterday went to MAU for evaluation because of high reading and felt lightheaded. Readings per chart at MAU normal.  Readings both similar and elevated today. Reviewed with Dr. Donavan Foil, orders given to start labetolol 100 mg po BID. bp check in 2 weeks since next ob is about 4 weeks out.   Pt also reports having issues getting meds filled at [facility] with them saying she doesn't have insurance and has to pay out of pocket for medications. She does have medicaid . She requested meds not yet filled be sent to [facility] pharmacy including labetolol today, phenergan, flexeril, aspirin, reglan. I called [facility] and verified meds not yet picked up and cancelled the orders. Orders sent to [facility] per her request. Advised if she has severe headaches , sudden edema to get evaluated at the hospital . She voices understanding.  Wilhelmenia Addis,RN

## 2020-06-29 NOTE — Progress Notes (Signed)
Patient was assessed and managed by nursing staff during this encounter. I have reviewed the chart and agree with the documentation and plan. I have also made any necessary editorial changes.  Warden Fillers, MD 06/29/2020 11:40 AM

## 2020-06-30 ENCOUNTER — Encounter: Payer: Self-pay | Admitting: Obstetrics and Gynecology

## 2020-06-30 DIAGNOSIS — R8271 Bacteriuria: Secondary | ICD-10-CM

## 2020-06-30 HISTORY — DX: Bacteriuria: R82.71

## 2020-06-30 LAB — URINE CULTURE, OB REFLEX

## 2020-06-30 LAB — CULTURE, OB URINE

## 2020-07-03 LAB — CYTOLOGY - PAP
Comment: NEGATIVE
Diagnosis: UNDETERMINED — AB
High risk HPV: NEGATIVE

## 2020-07-06 ENCOUNTER — Ambulatory Visit: Payer: Medicaid Other

## 2020-07-06 ENCOUNTER — Other Ambulatory Visit: Payer: Self-pay

## 2020-07-06 ENCOUNTER — Telehealth: Payer: Self-pay | Admitting: Lactation Services

## 2020-07-06 ENCOUNTER — Encounter: Payer: Self-pay | Admitting: *Deleted

## 2020-07-06 ENCOUNTER — Ambulatory Visit (INDEPENDENT_AMBULATORY_CARE_PROVIDER_SITE_OTHER): Payer: Medicaid Other | Admitting: *Deleted

## 2020-07-06 VITALS — BP 128/71 | HR 87 | Ht 64.0 in | Wt 165.9 lb

## 2020-07-06 DIAGNOSIS — O0990 Supervision of high risk pregnancy, unspecified, unspecified trimester: Secondary | ICD-10-CM

## 2020-07-06 DIAGNOSIS — O10919 Unspecified pre-existing hypertension complicating pregnancy, unspecified trimester: Secondary | ICD-10-CM

## 2020-07-06 DIAGNOSIS — Z0130 Encounter for examination of blood pressure without abnormal findings: Secondary | ICD-10-CM

## 2020-07-06 NOTE — Progress Notes (Signed)
Here for bp check after starting labetolol 100 mg po bid after nurse visit 06/29/20. Is [redacted] weeks pregnant now.  States bp at home mostly 120's up to 140/81. Today bp 128/71. States no headache since 06/29/20 althought feels like might get one today because of stressful situation with family. Support given. Advised to keep taking labetolol as ordered and appointments as scheduled. She will start taking baby aspirin today. Unable to get FHR with doppler. Dr. Alysia Penna in and obtained FHR x2 with handheld Ipad. Pt has confirmatory [place] next week. Patient voices understanding with plan of care. Shaneil Yazdi,RN

## 2020-07-06 NOTE — Telephone Encounter (Signed)
Genetic Screening Panorama results show High Risk for Vanishing Twin, Twin Pregnancy or Triploidy.   Spoke with Dr. Alysia Penna who recommends early [place].   Called Radiology Scheduling and spoke with Tasha. [place] scheduled for June 2 at 3:45, arrive at 3:30 with full bladder.   Called patient and informed her of Panorama results and [place] date, time and location. Patient voiced understanding.

## 2020-07-07 NOTE — Progress Notes (Signed)
Agree with A & P. 

## 2020-07-13 ENCOUNTER — Telehealth: Payer: Self-pay | Admitting: Obstetrics & Gynecology

## 2020-07-13 ENCOUNTER — Ambulatory Visit (INDEPENDENT_AMBULATORY_CARE_PROVIDER_SITE_OTHER): Payer: Medicaid Other | Admitting: Obstetrics and Gynecology

## 2020-07-13 ENCOUNTER — Encounter: Payer: Self-pay | Admitting: Family Medicine

## 2020-07-13 ENCOUNTER — Encounter: Payer: Self-pay | Admitting: Obstetrics and Gynecology

## 2020-07-13 ENCOUNTER — Ambulatory Visit
Admission: RE | Admit: 2020-07-13 | Discharge: 2020-07-13 | Disposition: A | Discharge status: Home / Self Care | Payer: Medicaid Other | Source: Ambulatory Visit | Attending: Obstetrics and Gynecology | Admitting: Obstetrics and Gynecology

## 2020-07-13 ENCOUNTER — Telehealth: Payer: Self-pay

## 2020-07-13 ENCOUNTER — Other Ambulatory Visit: Payer: Self-pay

## 2020-07-13 VITALS — BP 135/84 | HR 82

## 2020-07-13 DIAGNOSIS — O021 Missed abortion: Secondary | ICD-10-CM

## 2020-07-13 DIAGNOSIS — O0990 Supervision of high risk pregnancy, unspecified, unspecified trimester: Secondary | ICD-10-CM | POA: Diagnosis not present

## 2020-07-13 DIAGNOSIS — Z148 Genetic carrier of other disease: Secondary | ICD-10-CM | POA: Insufficient documentation

## 2020-07-13 HISTORY — DX: Missed abortion: O02.1

## 2020-07-13 NOTE — Telephone Encounter (Signed)
Called Pt to go over [employer], showing a carrier for [employer], no answer, left VM for call back.

## 2020-07-13 NOTE — Telephone Encounter (Signed)
Left message that I was calling in regards to recent [place] report.  Advised pt to call back to the office tomorrow morning to review results

## 2020-07-14 ENCOUNTER — Telehealth: Payer: Self-pay

## 2020-07-14 ENCOUNTER — Telehealth: Payer: Self-pay | Admitting: Obstetrics & Gynecology

## 2020-07-14 ENCOUNTER — Encounter: Payer: Self-pay | Admitting: Obstetrics and Gynecology

## 2020-07-14 LAB — ABO/RH: Rh Factor: POSITIVE

## 2020-07-14 NOTE — Telephone Encounter (Signed)
Pt returning call to Dr. Charlotta Newton Please see below    Ashlee Hidalgo, DO    07/13/20 6:13 PM Note Left message that I was calling in regards to recent [place] report.  Advised pt to call back to the office tomorrow morning to review results

## 2020-07-14 NOTE — Progress Notes (Signed)
Obstetrics and Gynecology Work In Visit  Appointment Date: 07/13/2020  OB[facility]N Clinic: [facility]  Chief Complaint: early pregnancy loss  History of Present Illness:  Ashlee Burch is a 25 y.o. G1P0010 with above CC. PMHx significant for cHTN.  Patient had u/s scheduled for today due to panorama testing that showed triploidy. Based on LMP, she should be 13/0 weeks today, but her u/s today showed her to 8/4 weeks, CRL 76m and no cardiac motion; this was her first formal u/s.  She denies any VB, fevers, chills, abdominal pain.   She is taking her labetalol   Review of Systems: as noted in the History of Present Illness.  Patient Active Problem List   Diagnosis Date Noted  . 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 Missed abortion 07/13/2020  . GBS bacteriuria 06/30/2020  . Chronic hypertension affecting pregnancy 06/26/2020  . Supervision of high risk pregnancy, antepartum 06/26/2020  . Migraine   . OBESITY, NOS 04/10/2006   Medications:  JAshante Yellin BSukuphad no medications administered during this visit. Current Outpatient Medications  Medication Sig Dispense Refill  . Blood Pressure Monitoring (BLOOD PRESSURE KIT) DEVI 1 Device by Does not apply route as needed. 1 each 0  . labetalol (NORMODYNE) 100 MG tablet Take 1 tablet (100 mg total) by mouth 2 (two) times daily. 60 tablet 0  . Prenatal Vit-Fe Fumarate-FA (PRENATAL MULTIVITAMIN) TABS tablet Take 1 tablet by mouth daily at 12 noon.    .Marland Kitchenaspirin EC 81 MG tablet Take 1 tablet (81 mg total) by mouth daily. Swallow whole. (Patient not taking: No sig reported) 30 tablet 11  . cyclobenzaprine (FLEXERIL) 5 MG tablet Take 1 tablet (5 mg total) by mouth 3 (three) times daily as needed (headache). (Patient not taking: No sig reported) 20 tablet 0  . metoCLOPramide (REGLAN) 10 MG tablet Take 1 tablet (10 mg total) by mouth every 8 (eight) hours as needed (take with tylenol for headache).  (Patient not taking: No sig reported) 30 tablet 0  . promethazine (PHENERGAN) 25 MG tablet Take 1 tablet (25 mg total) by mouth every 6 (six) hours as needed for nausea or vomiting. (Patient not taking: Reported on 07/13/2020) 30 tablet 0  . terconazole (TERAZOL 7) 0.4 % vaginal cream Place 1 applicator vaginally at bedtime. (Patient not taking: Reported on 07/13/2020) 45 g 0   No current facility-administered medications for this visit.    Allergies: has No Known Allergies.  Physical Exam:  BP 135/84   Pulse 82   LMP 04/13/2020  There is no height or weight on file to calculate BMI. General appearance: Well nourished, well developed female in no acute distress.  Neuro/Psych:  Normal mood and affect.    Labs: no new labs  Radiology: Images reviewed by me  Narrative & Impression  CLINICAL DATA:  Rule out twin gestation.  EXAM: OBSTETRIC <14 WK [place] TRANSVAGINAL OB [place] TECHNIQUE: Both transabdominal and transvaginal ultrasound examinations were performed for complete evaluation of the gestation as well as the maternal uterus, adnexal regions, and pelvic cul-de-sac. Transvaginal technique was performed to assess early pregnancy.  COMPARISON:  None.  FINDINGS: Intrauterine gestational sac: Single  Yolk sac:  Visualized  Embryo:  Visualized  Cardiac Activity: Not visualized  Heart Rate: N/A  bpm  CRL: 20 mm   8 w   4 d                  [place]  EDC: 02/18/2021  Subchorionic hemorrhage:  None visualized.  Maternal uterus/adnexae: Maternal ovaries unremarkable. No free fluid.  IMPRESSION: Single intrauterine gestational sac identified. Crown-rump length estimates an 8 week 4 day gestational age although no fetal heart activity evident on 2D grayscale or doppler assessment. Findings meet definitive criteria for failed pregnancy. This follows SRU consensus guidelines: Diagnostic Criteria for Nonviable Pregnancy Early in the First Trimester. Alison Stalling J Med  [phone].  These results will be called to the ordering clinician or representative by the Radiologist Assistant, and communication documented in the PACS or [employer].   Electronically Signed   By: Misty Stanley M.D.   On: 07/13/2020 16:38   Assessment: pt stable  Plan:  1. Missed abortion Condolences given. I told her I recommend suction d&c given how far along she is, which she is amenable to. Request sent for surgery scheduling  She did not get a blood type at her NOB visit and there's nothing in the system. I told her we'll call her tomorrow with results of her lab today  ED precautions given.   2. Supervision of high risk pregnancy, antepartum - Antibody screen - ABO/Rh  3. cHTN Continue on labetalol for now.  RTC: post op  Durene Romans MD Attending [facility])

## 2020-07-14 NOTE — Telephone Encounter (Signed)
Called pt to notify her of ABO RH result: A positive. Explained to pt she will not need rhogam injection.

## 2020-07-17 ENCOUNTER — Telehealth: Payer: Self-pay

## 2020-07-17 ENCOUNTER — Other Ambulatory Visit (HOSPITAL_BASED_OUTPATIENT_CLINIC_OR_DEPARTMENT_OTHER): Payer: Self-pay | Admitting: Obstetrics & Gynecology

## 2020-07-17 ENCOUNTER — Encounter (HOSPITAL_BASED_OUTPATIENT_CLINIC_OR_DEPARTMENT_OTHER): Payer: Self-pay | Admitting: Obstetrics & Gynecology

## 2020-07-17 ENCOUNTER — Other Ambulatory Visit: Payer: Self-pay

## 2020-07-17 NOTE — Telephone Encounter (Signed)
Called patient, no answer, left message with surgery date, time, COVID testing and pre-op instructions.   Surgery scheduled for 07/18/2020 @1100 , arrive at 9:00 to the [facility]. Advised COVID testing needs to be completed today, address given, office call back number left w/ voicemail.

## 2020-07-17 NOTE — Telephone Encounter (Signed)
Pt's mother called the office and spoke with front office staff. Mother is concerned that pt was sent home without any plan and without helping the patient feel better. Mother told by front office staff that we may only speak directly to the patient about her medical information.   Per chart review, Dr. Vergie Living requested scheduling for suction d & c. Pt was seen 07/13/20 by Vergie Living.

## 2020-07-17 NOTE — Progress Notes (Signed)
Spoke w/ via phone for pre-op interview--- Pt Lab needs dos----  Istat and EKG/  Pre-op orders pending             Lab results------ no COVID test -----patient states asymptomatic no test needed Arrive at ------- 0900 on 07-18-2020 NPO after MN NO Solid Food.  Clear liquids from MN until--- 0800 Med rec completed Medications to take morning of surgery ----- Labetolol Diabetic medication ----- n/a Patient instructed no nail polish to be worn day of surgery Patient instructed to bring photo id and insurance card day of surgery Patient aware to have Driver (ride ) / caregiver    for 24 hours after surgery-- boyfriend, Ashlee Burch  Patient Special Instructions ----- n./a Pre-Op special Istructions ----- case just added on today, orders pending Patient verbalized understanding of instructions that were given at this phone interview. Patient denies shortness of breath, chest pain, fever, cough at this phone interview.

## 2020-07-17 NOTE — Telephone Encounter (Signed)
Called pt with procedure date. Pt stated she received a VM with date/time. Pt able to confirm arrival time and location of surgery. Reviewed address for COVID test needed today.

## 2020-07-18 ENCOUNTER — Encounter (HOSPITAL_BASED_OUTPATIENT_CLINIC_OR_DEPARTMENT_OTHER): Payer: Self-pay | Admitting: Obstetrics & Gynecology

## 2020-07-18 ENCOUNTER — Ambulatory Visit (HOSPITAL_BASED_OUTPATIENT_CLINIC_OR_DEPARTMENT_OTHER)
Admission: RE | Admit: 2020-07-18 | Discharge: 2020-07-18 | Disposition: A | Discharge status: Home / Self Care | Payer: Medicaid Other | Attending: Obstetrics & Gynecology | Admitting: Obstetrics & Gynecology

## 2020-07-18 ENCOUNTER — Encounter (HOSPITAL_BASED_OUTPATIENT_CLINIC_OR_DEPARTMENT_OTHER)
Admission: RE | Disposition: A | Discharge status: Home / Self Care | Payer: Self-pay | Source: Home / Self Care | Attending: Obstetrics & Gynecology

## 2020-07-18 ENCOUNTER — Ambulatory Visit (HOSPITAL_BASED_OUTPATIENT_CLINIC_OR_DEPARTMENT_OTHER): Payer: Medicaid Other | Admitting: Certified Registered Nurse Anesthetist

## 2020-07-18 DIAGNOSIS — Z79899 Other long term (current) drug therapy: Secondary | ICD-10-CM | POA: Insufficient documentation

## 2020-07-18 DIAGNOSIS — Z3A08 8 weeks gestation of pregnancy: Secondary | ICD-10-CM | POA: Insufficient documentation

## 2020-07-18 DIAGNOSIS — O021 Missed abortion: Secondary | ICD-10-CM | POA: Insufficient documentation

## 2020-07-18 DIAGNOSIS — Z7982 Long term (current) use of aspirin: Secondary | ICD-10-CM | POA: Diagnosis not present

## 2020-07-18 HISTORY — DX: Genetic carrier of other disease: Z14.8

## 2020-07-18 HISTORY — PX: DILATION AND EVACUATION: SHX1459

## 2020-07-18 HISTORY — DX: Essential (primary) hypertension: I10

## 2020-07-18 HISTORY — DX: Presence of spectacles and contact lenses: Z97.3

## 2020-07-18 LAB — CBC
HCT: 38.6 % (ref 36.0–46.0)
Hemoglobin: 13.6 g/dL (ref 12.0–15.0)
MCH: 28.6 pg (ref 26.0–34.0)
MCHC: 35.2 g/dL (ref 30.0–36.0)
MCV: 81.3 fL (ref 80.0–100.0)
Platelets: 340 10*3/uL (ref 150–400)
RBC: 4.75 MIL/uL (ref 3.87–5.11)
RDW: 13.5 % (ref 11.5–15.5)
WBC: 11.4 10*3/uL — ABNORMAL HIGH (ref 4.0–10.5)
nRBC: 0 % (ref 0.0–0.2)

## 2020-07-18 LAB — TYPE AND SCREEN
ABO/RH(D): A POS
Antibody Screen: NEGATIVE

## 2020-07-18 LAB — POCT I-STAT, CHEM 8
BUN: 7 mg/dL (ref 6–20)
Calcium, Ion: 1.28 mmol/L (ref 1.15–1.40)
Chloride: 103 mmol/L (ref 98–111)
Creatinine, Ser: 0.5 mg/dL (ref 0.44–1.00)
Glucose, Bld: 88 mg/dL (ref 70–99)
HCT: 41 % (ref 36.0–46.0)
Hemoglobin: 13.9 g/dL (ref 12.0–15.0)
Potassium: 3.7 mmol/L (ref 3.5–5.1)
Sodium: 136 mmol/L (ref 135–145)
TCO2: 20 mmol/L — ABNORMAL LOW (ref 22–32)

## 2020-07-18 SURGERY — DILATION AND EVACUATION, UTERUS
Anesthesia: General | Site: Vagina

## 2020-07-18 MED ORDER — FENTANYL CITRATE (PF) 100 MCG/2ML IJ SOLN
INTRAMUSCULAR | Status: AC
  Filled 2020-07-18: qty 2

## 2020-07-18 MED ORDER — LACTATED RINGERS IV SOLN: INTRAVENOUS | Status: DC

## 2020-07-18 MED ORDER — ACETAMINOPHEN 500 MG PO TABS
1000.0000 mg | ORAL_TABLET | Freq: Once | ORAL | Status: AC
  Administered 2020-07-18: 1000 mg via ORAL

## 2020-07-18 MED ORDER — MIDAZOLAM HCL 2 MG/2ML IJ SOLN
INTRAMUSCULAR | Status: AC
  Filled 2020-07-18: qty 2

## 2020-07-18 MED ORDER — ACETAMINOPHEN 500 MG PO TABS
ORAL_TABLET | ORAL | Status: AC
  Filled 2020-07-18: qty 2

## 2020-07-18 MED ORDER — ONDANSETRON HCL 4 MG/2ML IJ SOLN
INTRAMUSCULAR | Status: DC | PRN
  Administered 2020-07-18: 4 mg via INTRAVENOUS

## 2020-07-18 MED ORDER — LIDOCAINE HCL 1 % IJ SOLN
INTRAMUSCULAR | Status: DC | PRN
  Administered 2020-07-18: 10 mL

## 2020-07-18 MED ORDER — KETOROLAC TROMETHAMINE 30 MG/ML IJ SOLN
INTRAMUSCULAR | Status: DC | PRN
  Administered 2020-07-18: 30 mg via INTRAVENOUS

## 2020-07-18 MED ORDER — DEXAMETHASONE SODIUM PHOSPHATE 10 MG/ML IJ SOLN
INTRAMUSCULAR | Status: DC | PRN
  Administered 2020-07-18: 10 mg via INTRAVENOUS

## 2020-07-18 MED ORDER — LIDOCAINE HCL (PF) 2 % IJ SOLN
INTRAMUSCULAR | Status: AC
  Filled 2020-07-18: qty 5

## 2020-07-18 MED ORDER — PROMETHAZINE HCL 25 MG/ML IJ SOLN: 6.2500 mg | INTRAMUSCULAR | Status: DC | PRN

## 2020-07-18 MED ORDER — PROPOFOL 10 MG/ML IV BOLUS
INTRAVENOUS | Status: DC | PRN
  Administered 2020-07-18: 200 mg via INTRAVENOUS

## 2020-07-18 MED ORDER — OXYCODONE HCL 5 MG/5ML PO SOLN
5.0000 mg | Freq: Once | ORAL | Status: DC | PRN
Start: 2020-07-18 — End: 2020-07-18

## 2020-07-18 MED ORDER — IBUPROFEN 800 MG PO TABS
800.0000 mg | ORAL_TABLET | Freq: Three times a day (TID) | ORAL | 0 refills | Status: DC | PRN
Start: 2020-07-18 — End: 2021-09-04

## 2020-07-18 MED ORDER — PHENYLEPHRINE 40 MCG/ML (10ML) SYRINGE FOR IV PUSH (FOR BLOOD PRESSURE SUPPORT)
PREFILLED_SYRINGE | INTRAVENOUS | Status: AC
  Filled 2020-07-18: qty 10

## 2020-07-18 MED ORDER — KETOROLAC TROMETHAMINE 30 MG/ML IJ SOLN
INTRAMUSCULAR | Status: AC
  Filled 2020-07-18: qty 1

## 2020-07-18 MED ORDER — FENTANYL CITRATE (PF) 100 MCG/2ML IJ SOLN: 25.0000 ug | INTRAMUSCULAR | Status: DC | PRN

## 2020-07-18 MED ORDER — HYDROCODONE-ACETAMINOPHEN 5-325 MG PO TABS: 1.0000 | ORAL_TABLET | Freq: Four times a day (QID) | ORAL | 0 refills | Status: AC | PRN

## 2020-07-18 MED ORDER — LIDOCAINE 2% (20 MG/ML) 5 ML SYRINGE
INTRAMUSCULAR | Status: DC | PRN
  Administered 2020-07-18: 80 mg via INTRAVENOUS

## 2020-07-18 MED ORDER — SILVER NITRATE-POT NITRATE 75-25 % EX MISC
CUTANEOUS | Status: DC | PRN
  Administered 2020-07-18: 2

## 2020-07-18 MED ORDER — FENTANYL CITRATE (PF) 100 MCG/2ML IJ SOLN
INTRAMUSCULAR | Status: DC | PRN
  Administered 2020-07-18 (×2): 50 ug via INTRAVENOUS

## 2020-07-18 MED ORDER — PROPOFOL 10 MG/ML IV BOLUS
INTRAVENOUS | Status: AC
  Filled 2020-07-18: qty 20

## 2020-07-18 MED ORDER — POVIDONE-IODINE 10 % EX SWAB: 2.0000 "application " | Freq: Once | CUTANEOUS | Status: DC

## 2020-07-18 MED ORDER — CEFAZOLIN SODIUM 1 G IJ SOLR
INTRAMUSCULAR | Status: AC
  Filled 2020-07-18: qty 20

## 2020-07-18 MED ORDER — SCOPOLAMINE 1 MG/3DAYS TD PT72
MEDICATED_PATCH | TRANSDERMAL | Status: AC
  Filled 2020-07-18: qty 1

## 2020-07-18 MED ORDER — PHENYLEPHRINE 40 MCG/ML (10ML) SYRINGE FOR IV PUSH (FOR BLOOD PRESSURE SUPPORT)
PREFILLED_SYRINGE | INTRAVENOUS | Status: DC | PRN
  Administered 2020-07-18: 80 ug via INTRAVENOUS

## 2020-07-18 MED ORDER — AMISULPRIDE (ANTIEMETIC) 5 MG/2ML IV SOLN
10.0000 mg | Freq: Once | INTRAVENOUS | Status: DC | PRN
Start: 2020-07-18 — End: 2020-07-18

## 2020-07-18 MED ORDER — OXYCODONE HCL 5 MG PO TABS
5.0000 mg | ORAL_TABLET | Freq: Once | ORAL | Status: DC | PRN
Start: 2020-07-18 — End: 2020-07-18

## 2020-07-18 MED ORDER — MIDAZOLAM HCL 5 MG/5ML IJ SOLN
INTRAMUSCULAR | Status: DC | PRN
  Administered 2020-07-18: 2 mg via INTRAVENOUS

## 2020-07-18 MED ORDER — CEFAZOLIN SODIUM-DEXTROSE 2-3 GM-%(50ML) IV SOLR
INTRAVENOUS | Status: DC | PRN
  Administered 2020-07-18: 2 g via INTRAVENOUS

## 2020-07-18 MED ORDER — ONDANSETRON HCL 4 MG/2ML IJ SOLN
INTRAMUSCULAR | Status: AC
  Filled 2020-07-18: qty 2

## 2020-07-18 MED ORDER — SCOPOLAMINE 1 MG/3DAYS TD PT72
1.0000 | MEDICATED_PATCH | TRANSDERMAL | Status: DC
  Administered 2020-07-18: 1.5 mg via TRANSDERMAL

## 2020-07-18 MED ORDER — DEXAMETHASONE SODIUM PHOSPHATE 10 MG/ML IJ SOLN
INTRAMUSCULAR | Status: AC
  Filled 2020-07-18: qty 1

## 2020-07-18 SURGICAL SUPPLY — 25 items
CATH ROBINSON RED A/P 16FR (CATHETERS) ×1 IMPLANT
COVER WAND RF STERILE (DRAPES) ×3 IMPLANT
FILTER UTR ASPR ASSEMBLY (MISCELLANEOUS) ×3 IMPLANT
GLOVE SURG ENC MOIS LTX SZ7 (GLOVE) ×4 IMPLANT
GLOVE SURG LTX SZ6.5 (GLOVE) ×3 IMPLANT
GLOVE SURG UNDER POLY LF SZ7 (GLOVE) ×3 IMPLANT
GOWN STRL REUS W/TWL LRG LVL3 (GOWN DISPOSABLE) ×2 IMPLANT
GOWN STRL REUS W/TWL XL LVL3 (GOWN DISPOSABLE) ×5 IMPLANT
HOSE CONNECTING 18IN BERKELEY (TUBING) ×3 IMPLANT
KIT BERKELEY 1ST TRI 3/8 NO TR (MISCELLANEOUS) ×3 IMPLANT
KIT BERKELEY 1ST TRIMESTER 3/8 (MISCELLANEOUS) ×6 IMPLANT
KIT TURNOVER CYSTO (KITS) ×3 IMPLANT
NS IRRIG 500ML POUR BTL (IV SOLUTION) ×3 IMPLANT
PACK VAGINAL MINOR WOMEN LF (CUSTOM PROCEDURE TRAY) ×3 IMPLANT
PAD OB MATERNITY 4.3X12.25 (PERSONAL CARE ITEMS) ×3 IMPLANT
PAD PREP 24X48 CUFFED NSTRL (MISCELLANEOUS) ×3 IMPLANT
SCOPETTES 8  STERILE (MISCELLANEOUS)
SCOPETTES 8 STERILE (MISCELLANEOUS) IMPLANT
SET BERKELEY SUCTION TUBING (SUCTIONS) ×3 IMPLANT
TOWEL OR 17X26 10 PK STRL BLUE (TOWEL DISPOSABLE) ×3 IMPLANT
TRAP TISSUE FILTER (MISCELLANEOUS) ×4 IMPLANT
VACURETTE 10 RIGID CVD (CANNULA) IMPLANT
VACURETTE 7MM CVD STRL WRAP (CANNULA) IMPLANT
VACURETTE 8 RIGID CVD (CANNULA) ×2 IMPLANT
VACURETTE 9 RIGID CVD (CANNULA) IMPLANT

## 2020-07-18 NOTE — Anesthesia Postprocedure Evaluation (Signed)
Anesthesia Post Note  Patient: Ashlee Burch  Procedure(s) Performed: DILATATION AND EVACUATION (N/A Vagina )     Patient location during evaluation: PACU Anesthesia Type: General Level of consciousness: awake and alert Pain management: pain level controlled Vital Signs Assessment: post-procedure vital signs reviewed and stable Respiratory status: spontaneous breathing, nonlabored ventilation and respiratory function stable Cardiovascular status: blood pressure returned to baseline and stable Postop Assessment: no apparent nausea or vomiting Anesthetic complications: no   No complications documented.  Last Vitals:  Vitals:   07/18/20 1100 07/18/20 1110  BP: (!) 129/91   Pulse: 82   Resp: 18 17  Temp:    SpO2: 100%     Last Pain:  Vitals:   07/18/20 1110  TempSrc:   PainSc: 0-No pain                 Mellody Dance

## 2020-07-18 NOTE — Op Note (Signed)
07/18/2020  10:49 AM  PATIENT:  Ashlee Burch  25 y.o. female  PRE-OPERATIVE DIAGNOSIS:  Missed AB, genetic screening positive for triploidy  POST-OPERATIVE DIAGNOSIS:  Missed AB  PROCEDURE:  Procedure(s): DILATATION AND EVACUATION  SURGEON:  Jerene Bears  ASSISTANTS: OR staff.    ANESTHESIA:   general  ESTIMATED BLOOD LOSS: 300 mL  BLOOD ADMINISTERED:none   FLUIDS: 900 cc LR  UOP: 10 cc drained with I&O cath at beginning of procedure  SPECIMEN:  Products of conceptions  DISPOSITION OF SPECIMEN:  PATHOLOGY  FINDINGS: 8 week sized uterus, normal appearing products of conception  DESCRIPTION OF OPERATION: Patient was taken to the operating room.  She is placed in the supine position. SCDs were on her lower extremities and functioning properly. General anesthesia with an LMA was administered without difficulty. Dr. Romie Minus, anesthesia, oversaw case.  Legs were then placed in the [facility] stirrups in the low lithotomy position. The legs were lifted to the high lithotomy position and the Betadine prep was used on the inner thighs perineum and vagina x3. Patient was draped in a normal standard fashion. An in and out catheterization with a red rubber Foley catheter was performed. Approximately 10 cc of clear urine was noted. A bivalve speculum was placed the vagina. The anterior lip of the cervix was grasped with single-tooth tenaculum.  A paracervical block of 1% lidocaine was placed.  10 cc was used total. The cervix is dilated up to #24 [facility] dilators. The endometrial cavity sounded to 8 cm.   A curved #8 suction tip was obtained.  This was passed to the fundus, suction applied and tip rotated in a clock wise fashion.  This was done several times until no white tissue was visualized.  Using a #1 smooth curette, the cavity was curetted until gritty texture was noted in all quadrants.  The suction tip was passed to the fundus again and suction applied on final time.  Minimal bleeding was  noted.  At this point, procedure was ended.  Tenaculum was removed.  Silver nitrate was used to make tenaculum sites hemostatic.  Speculum was removed.  The prep was cleansed of the patient's skin. The legs are positioned back in the supine position. Sponge, lap, needle, initially counts were correct x2. Patient was taken to recovery in stable condition.  COUNTS:  YES  PLAN OF CARE: Transfer to PACU

## 2020-07-18 NOTE — H&P (Addendum)
Ashlee Burch is an 25 y.o. female G30 P41 71 AA female here for suction D&E due to missed abortion.  Pt had ultrasound on 07/13/2020 showing 8 4/7 week fetus with no fetal heart rate.  She aroudn 14 weeks by LMP.  Panorama testing was positive for increased risks of triploidy.  Intervention recommended and she desires to proceed.  Risks and benefits including bleeding, infection, perforation, intrauterine scarring all discussed.  Pt's questions answered.  She is here and ready to proceed.   Patient's last menstrual period was 04/13/2020.  MBT A+.    Past Medical History:  Diagnosis Date  . Carrier of spinal muscular atrophy   . Hypertension    followed by pcp  . Migraine   . Missed ab 07/13/2020  . Wears glasses     Past Surgical History:  Procedure Laterality Date  . NO PAST SURGERIES    . WISDOM TOOTH EXTRACTION  teen    Family History  Problem Relation Age of Onset  . Hypertension Mother     Social History:  reports that she has never smoked. She has never used smokeless tobacco. She reports that she does not drink alcohol and does not use drugs.  Allergies: No Known Allergies  Medications Prior to Admission  Medication Sig Dispense Refill Last Dose  . aspirin EC 81 MG tablet Take 1 tablet (81 mg total) by mouth daily. Swallow whole. 30 tablet 11 07/13/2020  . labetalol (NORMODYNE) 100 MG tablet Take 1 tablet (100 mg total) by mouth 2 (two) times daily. (Patient taking differently: Take 100 mg by mouth 2 (two) times daily.) 60 tablet 0 07/18/2020 at 0800  . metoCLOPramide (REGLAN) 10 MG tablet Take 1 tablet (10 mg total) by mouth every 8 (eight) hours as needed (take with tylenol for headache). 30 tablet 0 07/17/2020 at Unknown time  . promethazine (PHENERGAN) 25 MG tablet Take 1 tablet (25 mg total) by mouth every 6 (six) hours as needed for nausea or vomiting. 30 tablet 0 07/16/2020  . Blood Pressure Monitoring (BLOOD PRESSURE KIT) DEVI 1 Device by Does not apply route as needed. 1  each 0   . cyclobenzaprine (FLEXERIL) 5 MG tablet Take 1 tablet (5 mg total) by mouth 3 (three) times daily as needed (headache). (Patient not taking: No sig reported) 20 tablet 0 Unknown at Unknown time  . Prenatal Vit-Fe Fumarate-FA (PRENATAL MULTIVITAMIN) TABS tablet Take 1 tablet by mouth daily at 12 noon. (Patient not taking: No sig reported)   07/13/2020 at Unknown time  . terconazole (TERAZOL 7) 0.4 % vaginal cream Place 1 applicator vaginally at bedtime. (Patient not taking: No sig reported) 45 g 0 Unknown at Unknown time    Review of Systems  All other systems reviewed and are negative.   Blood pressure 138/83, pulse 82, temperature 99.6 F (37.6 C), temperature source Oral, resp. rate (!) 22, height 5' 4"  (1.626 m), weight 74 kg, last menstrual period 04/13/2020, SpO2 100 %. Physical Exam Constitutional:      Appearance: Normal appearance.  Cardiovascular:     Rate and Rhythm: Normal rate.  Neurological:     General: No focal deficit present.     Mental Status: She is alert.  Psychiatric:        Mood and Affect: Mood normal.     Results for orders placed or performed during the hospital encounter of 07/18/20 (from the past 24 hour(s))  CBC     Status: Abnormal   Collection Time: 07/18/20  9:51 AM  Result Value Ref Range   WBC 11.4 (H) 4.0 - 10.5 K/uL   RBC 4.75 3.87 - 5.11 MIL/uL   Hemoglobin 13.6 12.0 - 15.0 g/dL   HCT 38.6 36.0 - 46.0 %   MCV 81.3 80.0 - 100.0 fL   MCH 28.6 26.0 - 34.0 pg   MCHC 35.2 30.0 - 36.0 g/dL   RDW 13.5 11.5 - 15.5 %   Platelets 340 150 - 400 K/uL   nRBC 0.0 0.0 - 0.2 %  I-STAT, chem 8     Status: Abnormal   Collection Time: 07/18/20  9:56 AM  Result Value Ref Range   Sodium 136 135 - 145 mmol/L   Potassium 3.7 3.5 - 5.1 mmol/L   Chloride 103 98 - 111 mmol/L   BUN 7 6 - 20 mg/dL   Creatinine, Ser 0.50 0.44 - 1.00 mg/dL   Glucose, Bld 88 70 - 99 mg/dL   Calcium, Ion 1.28 1.15 - 1.40 mmol/L   TCO2 20 (L) 22 - 32 mmol/L   Hemoglobin  13.9 12.0 - 15.0 g/dL   HCT 41.0 36.0 - 46.0 %    No results found.  Assessment/Plan: 25 yo G1 with 8 weeks missed abortion here for suction D&E.  Questions answered.  Pt ready to proceed.  Megan Salon 07/18/2020, 10:08 AM

## 2020-07-18 NOTE — Anesthesia Preprocedure Evaluation (Addendum)
Anesthesia Evaluation  Patient identified by MRN, date of birth, ID band Patient awake    Reviewed: Allergy & Precautions, NPO status , Patient's Chart, lab work & pertinent test results  Airway Mallampati: II  TM Distance: >3 FB Neck ROM: Full    Dental no notable dental hx.    Pulmonary    Pulmonary exam normal breath sounds clear to auscultation       Cardiovascular hypertension, Pt. on medications and Pt. on home beta blockers Normal cardiovascular exam Rhythm:Regular Rate:Normal     Neuro/Psych  Headaches, negative psych ROS   GI/Hepatic negative GI ROS, Neg liver ROS,   Endo/Other  negative endocrine ROS  Renal/GU negative Renal ROS  negative genitourinary   Musculoskeletal negative musculoskeletal ROS (+)   Abdominal   Peds negative pediatric ROS (+)  Hematology negative hematology ROS (+)   Anesthesia Other Findings   Reproductive/Obstetrics negative OB ROS                            Anesthesia Physical Anesthesia Plan  ASA: II  Anesthesia Plan: General   Post-op Pain Management:    Induction:   PONV Risk Score and Plan: 2 and Treatment may vary due to age or medical condition  Airway Management Planned: LMA  Additional Equipment: None  Intra-op Plan:   Post-operative Plan: Extubation in OR  Informed Consent: I have reviewed the patients History and Physical, chart, labs and discussed the procedure including the risks, benefits and alternatives for the proposed anesthesia with the patient or authorized representative who has indicated his/her understanding and acceptance.     Dental advisory given  Plan Discussed with: Anesthesiologist and CRNA  Anesthesia Plan Comments:        Anesthesia Quick Evaluation

## 2020-07-18 NOTE — Discharge Instructions (Signed)
Post Anesthesia Home Care Instructions  Activity: Get plenty of rest for the remainder of the day. A responsible individual must stay with you for 24 hours following the procedure.  For the next 24 hours, DO NOT: -Drive a car -Operate machinery -Drink alcoholic beverages -Take any medication unless instructed by your physician -Make any legal decisions or sign important papers.  Meals: Start with liquid foods such as gelatin or soup. Progress to regular foods as tolerated. Avoid greasy, spicy, heavy foods. If nausea and/or vomiting occur, drink only clear liquids until the nausea and/or vomiting subsides. Call your physician if vomiting continues.  Special Instructions/Symptoms: Your throat may feel dry or sore from the anesthesia or the breathing tube placed in your throat during surgery. If this causes discomfort, gargle with warm salt water. The discomfort should disappear within 24 hours.  If you had a scopolamine patch placed behind your ear for the management of post- operative nausea and/or vomiting:  1. The medication in the patch is effective for 72 hours, after which it should be removed.  Wrap patch in a tissue and discard in the trash. Wash hands thoroughly with soap and water. 2. You may remove the patch earlier than 72 hours if you experience unpleasant side effects which may include dry mouth, dizziness or visual disturbances. 3. Avoid touching the patch. Wash your hands with soap and water after contact with the patch.        Dilation and Curettage or Vacuum Curettage, Care After This sheet gives you information about how to care for yourself after your procedure. Your doctor may also give you more specific instructions. If you have problems or questions, contact your doctor. What can I expect after the procedure? After the procedure, it is common to have:  Mild pain or cramping.  Some bleeding or spotting from the vagina. These may last for up to 2 weeks. Follow  these instructions at home: Medicines  Take over-the-counter and prescription medicines only as told by your doctor. This is very important if you take blood-thinning medicine.  Ask your doctor if the medicine prescribed to you requires you to avoid driving or using machinery. Activity  If you were given a medicine to help you relax (sedative) during your procedure, it can affect you for many hours. Do not drive or use machinery until your doctor says that it is safe.  Rest as told by your doctor.  Do not sit for a long time without moving. Get up to take short walks every 1-2 hours. This is important. Ask for help if you feel weak or unsteady.  Do not lift anything that is heavier than 10 lb (4.5 kg), or the limit that you are told, until your doctor says that it is safe.  Return to your normal activities as told by your doctor. Ask your doctor what activities are safe for you.   Lifestyle For at least 2 weeks, or as long as told by your doctor:  Do not douche.  Do not use tampons.  Do not have sex. General instructions  Wear compression stockings as told by your doctor.  It is up to you to get the results of your procedure. Ask your doctor, or the department that is doing the procedure, when your results will be ready.  Keep all follow-up visits as told by your doctor. This is important. Contact a doctor if:  You have very bad cramps that get worse or do not get better with medicine.  You   have very bad pain in your belly (abdomen).  You cannot drink fluids without vomiting.  You have pain in a different part of your pelvis. The pelvis is the area just above your thighs.  You have fluid from your vagina that smells bad.  You have a rash. Get help right away if:  You are bleeding a lot from your vagina. A lot of bleeding means soaking more than one sanitary pad in 1 hour for 2 hours in a row.  You have a fever that is above 100.4F (38.0C).  Your belly feels very  tender or hard.  You have chest pain.  You have trouble breathing.  You feel dizzy.  You feel light-headed.  You pass out (faint).  You have pain in your neck or shoulder area. These symptoms may be an emergency. Do not wait to see if the symptoms will go away. Get medical help right away. Call your local emergency services (911 in the [place] Do not drive yourself to the hospital. Summary  After your procedure, it is common to have pain or cramping. It is also common to have bleeding or spotting from your vagina.  Rest as told. Do not sit for a long time without moving. Get up to take short walks every 1-2 hours.  Do not lift anything that is heavier than 10 lb (4.5 kg), or the limit that you are told.  Contact your doctor if you have fluid from your vagina that smells bad.  Get help right away if you develop any problems from the procedure. Ask your doctor what problems to watch for. This information is not intended to replace advice given to you by your health care provider. Make sure you discuss any questions you have with your health care provider. Document Revised: 03/02/2019 Document Reviewed: 03/02/2019 Elsevier Patient Education  2021 Elsevier Inc.  

## 2020-07-18 NOTE — Anesthesia Procedure Notes (Signed)
Procedure Name: LMA Insertion Date/Time: 07/18/2020 10:21 AM Performed by: Pearson Grippe, CRNA Pre-anesthesia Checklist: Patient identified, Emergency Drugs available, Suction available and Patient being monitored Patient Re-evaluated:Patient Re-evaluated prior to induction Oxygen Delivery Method: Circle system utilized Preoxygenation: Pre-oxygenation with 100% oxygen Induction Type: IV induction Ventilation: Mask ventilation without difficulty LMA: LMA inserted LMA Size: 4.0 Number of attempts: 1 Airway Equipment and Method: Bite block Placement Confirmation: positive ETCO2 Tube secured with: Tape Dental Injury: Teeth and Oropharynx as per pre-operative assessment

## 2020-07-18 NOTE — Transfer of Care (Signed)
Immediate Anesthesia Transfer of Care Note  Patient: Ashlee Burch  Procedure(s) Performed: DILATATION AND EVACUATION (N/A Vagina )  Patient Location: PACU  Anesthesia Type:General  Level of Consciousness: awake, alert  and oriented  Airway & Oxygen Therapy: Patient Spontanous Breathing and Patient connected to face mask oxygen  Post-op Assessment: Report given to RN and Post -op Vital signs reviewed and stable  Post vital signs: Reviewed and stable  Last Vitals:  Vitals Value Taken Time  BP 122/80 07/18/20 1055  Temp    Pulse 86 07/18/20 1056  Resp 15 07/18/20 1056  SpO2 100 % 07/18/20 1056  Vitals shown include unvalidated device data.  Last Pain:  Vitals:   07/18/20 0951  TempSrc: Oral  PainSc: 0-No pain      Patients Stated Pain Goal: 6 (07/18/20 0951)  Complications: No complications documented.

## 2020-07-19 ENCOUNTER — Encounter (HOSPITAL_BASED_OUTPATIENT_CLINIC_OR_DEPARTMENT_OTHER): Payer: Self-pay | Admitting: Obstetrics & Gynecology

## 2020-07-19 LAB — SURGICAL PATHOLOGY

## 2020-07-24 ENCOUNTER — Encounter: Payer: Medicaid Other | Admitting: Student

## 2020-07-24 ENCOUNTER — Encounter: Payer: Self-pay | Admitting: *Deleted

## 2020-08-10 ENCOUNTER — Ambulatory Visit (INDEPENDENT_AMBULATORY_CARE_PROVIDER_SITE_OTHER): Payer: Medicaid Other | Admitting: Obstetrics and Gynecology

## 2020-08-10 ENCOUNTER — Encounter: Payer: Self-pay | Admitting: Obstetrics and Gynecology

## 2020-08-10 ENCOUNTER — Other Ambulatory Visit: Payer: Self-pay

## 2020-08-10 VITALS — BP 149/87 | HR 88 | Wt 170.6 lb

## 2020-08-10 DIAGNOSIS — O285 Abnormal chromosomal and genetic finding on antenatal screening of mother: Secondary | ICD-10-CM | POA: Insufficient documentation

## 2020-08-10 DIAGNOSIS — Z09 Encounter for follow-up examination after completed treatment for conditions other than malignant neoplasm: Secondary | ICD-10-CM

## 2020-08-10 NOTE — Progress Notes (Signed)
[facility] 08/10/2020  CC: regular post op check   Subjective:     Ashlee Burch is a 25 y.o. G1P0010 with above CC. Patient is s/p 6/7 suction d&c for missed AB. Pregnancy c/b triploidy on Panorama cffdna, cHTN.   Pathology was negative.  Patient not having any issues or problems; no period yet. Pt states periods were qmonth, regular prior to pregnancy  Review of Systems Pertinent items are noted in HPI.    Objective:    BP (!) 149/87   Pulse 88   Wt 170 lb 9.6 oz (77.4 kg)   LMP 04/13/2020   Breastfeeding No   BMI 29.28 kg/m  NAD  Assessment:    Doing well postoperatively.  Plan:   Patient may want to try soon but no right now; she declines BC. I told her that she should expect a period by the end of July and if not to take a home UPT and let us know either way. I told her I recommend waiting until period starts to try again. Patient just got a PCP and I told her I recommend making sure her BP, etc is optimized prior to any future pregnancies.  RTC PRN  Cornelia Copa MD Attending Center for [employer] [facility])

## 2020-08-22 ENCOUNTER — Other Ambulatory Visit: Payer: Self-pay | Admitting: Obstetrics and Gynecology

## 2020-08-22 DIAGNOSIS — O10919 Unspecified pre-existing hypertension complicating pregnancy, unspecified trimester: Secondary | ICD-10-CM

## 2020-08-22 DIAGNOSIS — O0990 Supervision of high risk pregnancy, unspecified, unspecified trimester: Secondary | ICD-10-CM

## 2020-08-23 MED ORDER — LABETALOL HCL 100 MG PO TABS: 100.0000 mg | ORAL_TABLET | Freq: Two times a day (BID) | ORAL | 0 refills | Status: DC

## 2020-08-24 ENCOUNTER — Other Ambulatory Visit: Payer: Medicaid Other

## 2020-08-24 ENCOUNTER — Ambulatory Visit: Payer: Medicaid Other

## 2021-01-17 ENCOUNTER — Other Ambulatory Visit: Payer: Self-pay | Admitting: Obstetrics and Gynecology

## 2021-01-17 DIAGNOSIS — O0990 Supervision of high risk pregnancy, unspecified, unspecified trimester: Secondary | ICD-10-CM

## 2021-01-17 DIAGNOSIS — O10919 Unspecified pre-existing hypertension complicating pregnancy, unspecified trimester: Secondary | ICD-10-CM

## 2021-02-11 NOTE — L&D Delivery Note (Signed)
Vaginal Delivery Note  Patient pushed for one hour after she was noted to be C/C/+2. Guided pushing with maternal urge and regular contractions. At  11:04 PM  a delivery of a viable and healthy Female was delivered over intact perineum via  Spontaneous vaginal delivery (Presentation: OA).  After head was delivered through a tight  nuchal cord, shoulders and body easily delivered.  Baby laid on maternal abdomen, bulb suction, drying and tactile stimulation performed. Baby noted to have a vigorous cry and moving all four extremities.  Delayed cord clamping done and cord cut by father. Cord blood obtained.   Placenta spontaneously delivered intact with trailing membranes. Uterine atony alleviated by IV pitocin and massage.  There were no lacerations noted upon inspection of the cervix, vagina and perineum.   Patient tolerated delivery well, there were no complications.    Delivery Details: Delivery Type: NSVD  Anesthesia Epidural   Episiotomy:   N/A  Lacerations: None   Repair suture: N/A  Blood loss (ml):  300   Birth information: Date of birth:  [birth year]  Time of birth:  23:04  Sex:  Female  Name:  "Ky'Shern"  APGAR APGAR (1 MIN): 7   APGAR (5 MINS): 9     Weight  Pending   Resuscitation:     Drying, stimulation, bulb suction  Cord information: 3 vessel cord    Complications:     None   Placenta: Delivered: Spontaneous, intact appearance: Normal    Disposition: Mom to postpartum.  Baby to Couplet care / Skin to Skin.  Essie Hart MD 09/02/2021, 11:24 PM

## 2021-08-31 ENCOUNTER — Other Ambulatory Visit: Payer: Self-pay | Admitting: Obstetrics & Gynecology

## 2021-09-02 ENCOUNTER — Inpatient Hospital Stay (HOSPITAL_COMMUNITY): Payer: Medicaid Other | Admitting: Anesthesiology

## 2021-09-02 ENCOUNTER — Inpatient Hospital Stay (HOSPITAL_COMMUNITY)
Admit: 2021-09-02 | Discharge: 2021-09-04 | DRG: 806 | Disposition: A | Discharge status: Home / Self Care | Payer: Medicaid Other | Attending: Obstetrics & Gynecology | Admitting: Obstetrics & Gynecology

## 2021-09-02 ENCOUNTER — Inpatient Hospital Stay (HOSPITAL_COMMUNITY): Payer: Medicaid Other

## 2021-09-02 ENCOUNTER — Encounter (HOSPITAL_COMMUNITY): Payer: Self-pay | Admitting: Obstetrics & Gynecology

## 2021-09-02 DIAGNOSIS — O1002 Pre-existing essential hypertension complicating childbirth: Secondary | ICD-10-CM | POA: Diagnosis present

## 2021-09-02 DIAGNOSIS — Z148 Genetic carrier of other disease: Secondary | ICD-10-CM | POA: Diagnosis not present

## 2021-09-02 DIAGNOSIS — Z3A38 38 weeks gestation of pregnancy: Secondary | ICD-10-CM

## 2021-09-02 DIAGNOSIS — D62 Acute posthemorrhagic anemia: Secondary | ICD-10-CM | POA: Diagnosis not present

## 2021-09-02 DIAGNOSIS — O9081 Anemia of the puerperium: Secondary | ICD-10-CM | POA: Diagnosis not present

## 2021-09-02 DIAGNOSIS — O99214 Obesity complicating childbirth: Secondary | ICD-10-CM | POA: Diagnosis present

## 2021-09-02 DIAGNOSIS — O99824 Streptococcus B carrier state complicating childbirth: Secondary | ICD-10-CM | POA: Diagnosis present

## 2021-09-02 DIAGNOSIS — O10919 Unspecified pre-existing hypertension complicating pregnancy, unspecified trimester: Secondary | ICD-10-CM | POA: Diagnosis present

## 2021-09-02 LAB — CBC
HCT: 33.3 % — ABNORMAL LOW (ref 36.0–46.0)
HCT: 32 % — ABNORMAL LOW (ref 36.0–46.0)
Hemoglobin: 11.3 g/dL — ABNORMAL LOW (ref 12.0–15.0)
Hemoglobin: 11.3 g/dL — ABNORMAL LOW (ref 12.0–15.0)
MCH: 28.1 pg (ref 26.0–34.0)
MCH: 28.8 pg (ref 26.0–34.0)
MCHC: 33.9 g/dL (ref 30.0–36.0)
MCHC: 35.3 g/dL (ref 30.0–36.0)
MCV: 82.8 fL (ref 80.0–100.0)
MCV: 81.4 fL (ref 80.0–100.0)
Platelets: 267 10*3/uL (ref 150–400)
Platelets: 269 10*3/uL (ref 150–400)
RBC: 4.02 MIL/uL (ref 3.87–5.11)
RBC: 3.93 MIL/uL (ref 3.87–5.11)
RDW: 12.8 % (ref 11.5–15.5)
RDW: 12.8 % (ref 11.5–15.5)
WBC: 9.9 10*3/uL (ref 4.0–10.5)
WBC: 9.1 10*3/uL (ref 4.0–10.5)
nRBC: 0 % (ref 0.0–0.2)
nRBC: 0 % (ref 0.0–0.2)

## 2021-09-02 LAB — COMPREHENSIVE METABOLIC PANEL
ALT: 11 U/L (ref 0–44)
AST: 17 U/L (ref 15–41)
Albumin: 2.5 g/dL — ABNORMAL LOW (ref 3.5–5.0)
Alkaline Phosphatase: 117 U/L (ref 38–126)
Anion gap: 7 (ref 5–15)
BUN: 6 mg/dL (ref 6–20)
CO2: 20 mmol/L — ABNORMAL LOW (ref 22–32)
Calcium: 8.5 mg/dL — ABNORMAL LOW (ref 8.9–10.3)
Chloride: 107 mmol/L (ref 98–111)
Creatinine, Ser: 0.57 mg/dL (ref 0.44–1.00)
GFR, Estimated: >60 mL/min (ref >60)
Glucose, Bld: 80 mg/dL (ref 70–99)
Potassium: 3.4 mmol/L — ABNORMAL LOW (ref 3.5–5.1)
Sodium: 134 mmol/L — ABNORMAL LOW (ref 135–145)
Total Bilirubin: 0.4 mg/dL (ref 0.3–1.2)
Total Protein: 5.6 g/dL — ABNORMAL LOW (ref 6.5–8.1)

## 2021-09-02 LAB — PROTEIN / CREATININE RATIO, URINE
Creatinine, Urine: 108 mg/dL
Protein Creatinine Ratio: 0.13 mg/mg{Cre} (ref 0.00–0.15)
Total Protein, Urine: 14 mg/dL

## 2021-09-02 LAB — RPR: RPR Ser Ql: NONREACTIVE

## 2021-09-02 LAB — TYPE AND SCREEN
ABO/RH(D): A POS
Antibody Screen: NEGATIVE

## 2021-09-02 LAB — OB RESULTS CONSOLE GBS: GBS: POSITIVE

## 2021-09-02 MED ORDER — TERBUTALINE SULFATE 1 MG/ML IJ SOLN: 0.2500 mg | Freq: Once | INTRAMUSCULAR | Status: DC | PRN

## 2021-09-02 MED ORDER — ONDANSETRON HCL 4 MG/2ML IJ SOLN
4.0000 mg | Freq: Four times a day (QID) | INTRAMUSCULAR | Status: DC | PRN
  Administered 2021-09-02: 4 mg via INTRAVENOUS
  Filled 2021-09-02: qty 2

## 2021-09-02 MED ORDER — PENICILLIN G POT IN DEXTROSE 60000 UNIT/ML IV SOLN
3.0000 10*6.[IU] | INTRAVENOUS | Status: DC
  Administered 2021-09-02 (×2): 3 10*6.[IU] via INTRAVENOUS
  Filled 2021-09-02 (×2): qty 50

## 2021-09-02 MED ORDER — LACTATED RINGERS IV SOLN
500.0000 mL | INTRAVENOUS | Status: DC | PRN
  Administered 2021-09-02: 500 mL via INTRAVENOUS

## 2021-09-02 MED ORDER — TRANEXAMIC ACID-NACL 1000-0.7 MG/100ML-% IV SOLN
INTRAVENOUS | Status: AC
  Administered 2021-09-02: 1000 mg
  Filled 2021-09-02: qty 100

## 2021-09-02 MED ORDER — EPHEDRINE 5 MG/ML INJ: 10.0000 mg | INTRAVENOUS | Status: DC | PRN

## 2021-09-02 MED ORDER — ACETAMINOPHEN 325 MG PO TABS: 650.0000 mg | ORAL_TABLET | ORAL | Status: DC | PRN

## 2021-09-02 MED ORDER — OXYTOCIN BOLUS FROM INFUSION
333.0000 mL | Freq: Once | INTRAVENOUS | Status: AC
  Administered 2021-09-02: 333 mL via INTRAVENOUS

## 2021-09-02 MED ORDER — LIDOCAINE HCL (PF) 1 % IJ SOLN: 30.0000 mL | INTRAMUSCULAR | Status: DC | PRN

## 2021-09-02 MED ORDER — PHENYLEPHRINE 80 MCG/ML (10ML) SYRINGE FOR IV PUSH (FOR BLOOD PRESSURE SUPPORT)
80.0000 ug | PREFILLED_SYRINGE | INTRAVENOUS | Status: DC | PRN
  Filled 2021-09-02: qty 10

## 2021-09-02 MED ORDER — DIPHENHYDRAMINE HCL 50 MG/ML IJ SOLN: 12.5000 mg | INTRAMUSCULAR | Status: DC | PRN

## 2021-09-02 MED ORDER — OXYTOCIN-SODIUM CHLORIDE 30-0.9 UT/500ML-% IV SOLN: 2.5000 [IU]/h | INTRAVENOUS | Status: DC

## 2021-09-02 MED ORDER — LACTATED RINGERS IV SOLN: 500.0000 mL | Freq: Once | INTRAVENOUS | Status: DC

## 2021-09-02 MED ORDER — FENTANYL-BUPIVACAINE-NACL 0.5-0.125-0.9 MG/250ML-% EP SOLN
EPIDURAL | Status: DC | PRN
  Administered 2021-09-02: 12 mL/h via EPIDURAL

## 2021-09-02 MED ORDER — PHENYLEPHRINE 80 MCG/ML (10ML) SYRINGE FOR IV PUSH (FOR BLOOD PRESSURE SUPPORT): 80.0000 ug | PREFILLED_SYRINGE | INTRAVENOUS | Status: DC | PRN

## 2021-09-02 MED ORDER — NIFEDIPINE ER OSMOTIC RELEASE 30 MG PO TB24
30.0000 mg | ORAL_TABLET | Freq: Every day | ORAL | Status: DC
  Administered 2021-09-02 – 2021-09-03 (×2): 30 mg via ORAL
  Filled 2021-09-02 (×3): qty 1

## 2021-09-02 MED ORDER — OXYTOCIN-SODIUM CHLORIDE 30-0.9 UT/500ML-% IV SOLN
1.0000 m[IU]/min | INTRAVENOUS | Status: DC
  Administered 2021-09-02: 2 m[IU]/min via INTRAVENOUS
  Filled 2021-09-02: qty 500

## 2021-09-02 MED ORDER — LIDOCAINE HCL (PF) 1 % IJ SOLN
INTRAMUSCULAR | Status: DC | PRN
  Administered 2021-09-02: 10 mL via EPIDURAL
  Administered 2021-09-02: 2 mL via EPIDURAL

## 2021-09-02 MED ORDER — SOD CITRATE-CITRIC ACID 500-334 MG/5ML PO SOLN: 30.0000 mL | ORAL | Status: DC | PRN

## 2021-09-02 MED ORDER — PENICILLIN G POTASSIUM 5000000 UNITS IJ SOLR
5.0000 10*6.[IU] | Freq: Once | INTRAMUSCULAR | Status: AC
  Administered 2021-09-02: 5 10*6.[IU] via INTRAVENOUS
  Filled 2021-09-02: qty 5

## 2021-09-02 MED ORDER — MISOPROSTOL 25 MCG QUARTER TABLET: 25.0000 ug | ORAL_TABLET | ORAL | Status: DC | PRN

## 2021-09-02 MED ORDER — FENTANYL-BUPIVACAINE-NACL 0.5-0.125-0.9 MG/250ML-% EP SOLN
12.0000 mL/h | EPIDURAL | Status: DC | PRN
  Filled 2021-09-02: qty 250

## 2021-09-02 MED ORDER — LACTATED RINGERS IV SOLN: INTRAVENOUS | Status: DC

## 2021-09-02 NOTE — H&P (Signed)
Ashlee Burch is a 26 y.o. female, G2P0010, IUP at 32 weeks, presenting for IOL for CHTN on procardia 30mg  XL daily, denies HA, RUQ pain or vision changes. GBS+. LR Female. Pt endorse + Fm. Denies vaginal leakage. Denies vaginal bleeding. Denies feeling cxt's.   Patient Active Problem List   Diagnosis Date Noted   HTN in pregnancy, chronic 09/02/2021   1st pregnancy missed AB due to triploidy 08/10/2020   Missed abortion 07/13/2020   Migraine    OBESITY, NOS 04/10/2006     Active Ambulatory Problems    Diagnosis Date Noted   OBESITY, NOS 04/10/2006   Migraine    Missed abortion 07/13/2020   1st pregnancy missed AB due to triploidy 08/10/2020   Resolved Ambulatory Problems    Diagnosis Date Noted   ELEVATED BLOOD PRESSURE WITHOUT DIAGNOSIS OF HYPERTENSION 07/02/2007   Chronic hypertension affecting pregnancy 06/26/2020   Supervision of high risk pregnancy, antepartum 06/26/2020   GBS bacteriuria 06/30/2020   Carrier of spinal muscular atrophy 07/13/2020   Past Medical History:  Diagnosis Date   Hypertension    Missed ab 07/13/2020   Wears glasses    Medications Prior to Admission  Medication Sig Dispense Refill Last Dose   ibuprofen (ADVIL) 800 MG tablet Take 1 tablet (800 mg total) by mouth every 8 (eight) hours as needed. (Patient not taking: Reported on 08/10/2020) 30 tablet 0    labetalol (NORMODYNE) 100 MG tablet Take 1 tablet (100 mg total) by mouth 2 (two) times daily. 60 tablet 0    Prenatal Vit-Fe Fumarate-FA (PRENATAL MULTIVITAMIN) TABS tablet Take 1 tablet by mouth daily at 12 noon. (Patient not taking: No sig reported)       Past Medical History:  Diagnosis Date   Carrier of spinal muscular atrophy    Chronic hypertension affecting pregnancy 06/26/2020   GBS bacteriuria 06/30/2020   Hypertension    followed by pcp   Migraine    Missed ab 07/13/2020   Wears glasses      No current facility-administered medications on file prior to encounter.   Current  Outpatient Medications on File Prior to Encounter  Medication Sig Dispense Refill   ibuprofen (ADVIL) 800 MG tablet Take 1 tablet (800 mg total) by mouth every 8 (eight) hours as needed. (Patient not taking: Reported on 08/10/2020) 30 tablet 0   labetalol (NORMODYNE) 100 MG tablet Take 1 tablet (100 mg total) by mouth 2 (two) times daily. 60 tablet 0   Prenatal Vit-Fe Fumarate-FA (PRENATAL MULTIVITAMIN) TABS tablet Take 1 tablet by mouth daily at 12 noon. (Patient not taking: No sig reported)       No Known Allergies  History of present pregnancy: Pt Info/Preference:  Screening/Consents:  Labs:   EDD: Estimated Date of Delivery: 09/16/21  Establised: No LMP recorded. Patient is pregnant.  Anatomy Scan: Date: 04/30/2021 Placenta Location: ??? Genetic Screen: Panoroma:LR female AFP:  First Tri: Quad:  Office: [facility]            First PNV: 7  weeks Blood Type --/--/A POS (07/23 0630)  Language: english Last PNV: 1 week ago Rhogam    Flu Vaccine:  declined   Antibody NEG (07/23 0630)  TDaP vaccine declined   GTT: Early: N/A Third Trimester: 118  Feeding Plan: breast BTL: no Rubella:  Immune  Contraception: ??? VBAC: no RPR:   NR  Circumcision: In pt desired   HBsAg:  Neg  Pediatrician:  Dr 07-03-1994   HIV:   NR  Prenatal Classes: no Additional [place]: 32 week 4.5lbs 35%, last Growth 2 weeks ago 6.5lbs, last BPP 2 days ago 8/8. GBS:  GBS+ 6/14(For PCN allergy, check sensitivities)       Chlamydia: neg    MFM Referral/Consult:  GC: neg  Support Person: Mother and partner   PAP: ???  Pain Management: epidural Neonatologist Referral:  Hgb Electrophoresis:  AA  Birth Plan: DCC   Hgb NOB: 13.5    28W: 11.2   OB History     Gravida  2   Para      Term      Preterm      AB  1   Living         SAB      IAB      Ectopic      Multiple      Live Births             Past Medical History:  Diagnosis Date   Carrier of spinal muscular atrophy    Chronic hypertension affecting pregnancy  06/26/2020   GBS bacteriuria 06/30/2020   Hypertension    followed by pcp   Migraine    Missed ab 07/13/2020   Wears glasses    Past Surgical History:  Procedure Laterality Date   DILATION AND EVACUATION N/A 07/18/2020   Procedure: DILATATION AND EVACUATION;  Surgeon: Jerene Bears, MD;  Location: [facility];  Service: Gynecology;  Laterality: N/A;   NO PAST SURGERIES     WISDOM TOOTH EXTRACTION  teen   Family History: family history includes Hypertension in her mother. Social History:  reports that she has never smoked. She has never used smokeless tobacco. She reports that she does not drink alcohol and does not use drugs.   Prenatal Transfer Tool  Maternal Diabetes: No Genetic Screening: Normal Maternal Ultrasounds/Referrals: Normal Fetal Ultrasounds or other Referrals:  None Maternal Substance Abuse:  No Significant Maternal Medications:  None Significant Maternal Lab Results: Group B Strep positive  ROS:  Review of Systems  Constitutional: Negative.   HENT: Negative.    Eyes: Negative.   Respiratory: Negative.    Cardiovascular: Negative.   Gastrointestinal: Negative.   Genitourinary: Negative.   Musculoskeletal: Negative.   Skin: Negative.   Neurological: Negative.   Endo/Heme/Allergies: Negative.      Physical Exam: BP (!) 153/97   Pulse 86   Temp 98.1 F (36.7 C) (Oral)   Resp 16   Ht 5\' 3"  (1.6 m)   Wt 83.4 kg   Breastfeeding Unknown   BMI 32.56 kg/m   Physical Exam Vitals and nursing note reviewed. Exam conducted with a chaperone present.  Constitutional:      Appearance: Normal appearance.  HENT:     Head: Normocephalic and atraumatic.     Mouth/Throat:     Mouth: Mucous membranes are moist.  Eyes:     Pupils: Pupils are equal, round, and reactive to light.  Cardiovascular:     Rate and Rhythm: Normal rate and regular rhythm.     Pulses: Normal pulses.     Heart sounds: Normal heart sounds.  Pulmonary:     Breath sounds:  Normal breath sounds.  Abdominal:     General: Bowel sounds are normal.  Genitourinary:    Comments: Uterus gravid adequal to dates, pelvis adequate for vaginal delivery.  Musculoskeletal:        General: Normal range of motion.     Cervical back: Normal range of  motion and neck supple.  Skin:    General: Skin is warm.     Capillary Refill: Capillary refill takes less than 2 seconds.  Neurological:     Mental Status: She is alert.  Psychiatric:        Mood and Affect: Mood normal.      NST: FHR baseline 140 bpm, Variability: moderate, Accelerations:present, Decelerations:  Absent= Cat 1/Reactive UC:   irregular, every 2-8 minutes SVE:   3/70/-2, vertex verified by fetal sutures.  Leopold's: Position verterx, EFW 7.5lbs. via leopold's.   Labs: Results for orders placed or performed during the hospital encounter of 09/02/21 (from the past 24 hour(s))  Type and screen     Status: None   Collection Time: 09/02/21  6:30 AM  Result Value Ref Range   ABO/RH(D) A POS    Antibody Screen NEG    Sample Expiration      09/05/2021,2359 Performed at [facility], [address] [place], [place] [address]   CBC     Status: Abnormal   Collection Time: 09/02/21  6:37 AM  Result Value Ref Range   WBC 9.9 4.0 - 10.5 K/uL   RBC 4.02 3.87 - 5.11 MIL/uL   Hemoglobin 11.3 (L) 12.0 - 15.0 g/dL   HCT 77.8 (L) 24.2 - 35.3 %   MCV 82.8 80.0 - 100.0 fL   MCH 28.1 26.0 - 34.0 pg   MCHC 33.9 30.0 - 36.0 g/dL   RDW 61.4 43.1 - 54.0 %   Platelets 267 150 - 400 K/uL   nRBC 0.0 0.0 - 0.2 %  Comprehensive metabolic panel     Status: Abnormal   Collection Time: 09/02/21  7:27 AM  Result Value Ref Range   Sodium 134 (L) 135 - 145 mmol/L   Potassium 3.4 (L) 3.5 - 5.1 mmol/L   Chloride 107 98 - 111 mmol/L   CO2 20 (L) 22 - 32 mmol/L   Glucose, Bld 80 70 - 99 mg/dL   BUN 6 6 - 20 mg/dL   Creatinine, Ser 0.86 0.44 - 1.00 mg/dL   Calcium 8.5 (L) 8.9 - 10.3 mg/dL   Total Protein 5.6 (L) 6.5 -  8.1 g/dL   Albumin 2.5 (L) 3.5 - 5.0 g/dL   AST 17 15 - 41 U/L   ALT 11 0 - 44 U/L   Alkaline Phosphatase 117 38 - 126 U/L   Total Bilirubin 0.4 0.3 - 1.2 mg/dL   GFR, Estimated >76 >19 mL/min   Anion gap 7 5 - 15    Imaging:  No results found.  MAU Course: Orders Placed This Encounter  Procedures   CBC   RPR   Comprehensive metabolic panel   Diet regular Room service appropriate? Yes; Fluid consistency: Thin   Vitals signs per unit policy   Notify physician (specify)   Fetal monitoring per unit policy   Activity as tolerated   Cervical Exam   Measure blood pressure post delivery every 15 min x 1 hour then every 30 min x 1 hour   Fundal check post delivery every 15 min x 1 hour then every 30 min x 1 hour   If Rapid HIV test positive or known HIV positive: initiate AZT orders   May in and out cath x 2 for inability to void   Insert urethral catheter X 1 PRN If Coude Catheter is chosen, qualified resources by campus can be found in the clinical skills nursing procedure for Coude Catheter 1.  If straight catheterized > 2 times or patient unable to void post epidural plac...   Refer to Sidebar Report Urinary (Foley) Catheter Indications   Refer to Sidebar Report Post Indwelling Urinary Catheter Removal and Intervention Guidelines   Discontinue foley prior to vaginal delivery   Initiate Carrier Fluid Protocol   Initiate Oral Care Protocol   SCDs   Patient may have epidural placement upon request   SCDs   Evaluate fetal heart rate to establish reassuring pattern prior to initiating Cytotec or Pitocin   Perform a cervical exam prior to initiating Cytotec or Pitocin   Discontinue Pitocin if tachysystole with non-reassuring FHR is present   Nofify MD/CNM if tachysystole with non-reassuring FHR is present   Initiate intrauterine resuscitation if tachysystole with non-reasuring FHR is present   If tachysystole WITH reassuring FHR present notify MD / CNM   May administer Terbutaline  0.25 mg SQ x 1 dose if tachysystole with non-reassuring FHR is presesnt   Labor Induction   Labor Induction   Care order/instruction: Notify Md for BP 160/110   Full code   Type and screen   Insert and maintain IV Line   Admit to Inpatient (patient's expected length of stay will be greater than 2 midnights or inpatient only procedure)   Meds ordered this encounter  Medications   lactated ringers infusion   oxytocin (PITOCIN) IV BOLUS FROM BAG   oxytocin (PITOCIN) IV infusion 30 units in NS 500 mL - Premix   lactated ringers infusion 500-1,000 mL   acetaminophen (TYLENOL) tablet 650 mg   ondansetron (ZOFRAN) injection 4 mg   sodium citrate-citric acid (ORACIT) solution 30 mL   lidocaine (PF) (XYLOCAINE) 1 % injection 30 mL   terbutaline (BRETHINE) injection 0.25 mg   oxytocin (PITOCIN) IV infusion 30 units in NS 500 mL - Premix    Order Specific Question:   Begin infusion at:    Answer:   2 milli-units/min (2 mL/hr)    Order Specific Question:   Increase infusion by:    Answer:   2 milli-units/min (2 mL/hr)   DISCONTD: terbutaline (BRETHINE) injection 0.25 mg   FOLLOWED BY Linked Order Group    penicillin G potassium 5 Million Units in sodium chloride 0.9 % 250 mL IVPB     Order Specific Question:   Antibiotic Indication:     Answer:   Group B Strep Prophylaxis    penicillin G potassium 3 Million Units in dextrose 26mL IVPB     Order Specific Question:   Antibiotic Indication:     Answer:   Group B Strep Prophylaxis   misoprostol (CYTOTEC) tablet 25 mcg    Assessment/Plan: JONITA HIROTA is a 26 y.o. female, G2P0010, IUP at 76 weeks, presenting for IOL for CHTN on procardia 30mg  XL daily, denies HA, RUQ pain or vision changes. GBS+. LR Female. Pt endorse + Fm. Denies vaginal leakage. Denies vaginal bleeding. Denies feeling cxt's.   FWB: Cat 1 Fetal Tracing.   Plan: Admit to Birthing Suite per consult with Dr Routine [facility] orders Pain med/epidural prn PCN G for GBS  prophylaxis  Pitocin 2x2 to start induction.  Anticipate AROM.  CHTN: Continue procardia 30mg  XL.  Anticipate labor progression   Mora Appl NP-C, CNM, MSN 09/02/2021, 8:48 AM

## 2021-09-02 NOTE — Anesthesia Procedure Notes (Signed)
Epidural Patient location during procedure: OB Start time: 09/02/2021 3:24 PM End time: 09/02/2021 3:34 PM  Staffing Anesthesiologist: Lannie Fields, DO Performed: anesthesiologist   Preanesthetic Checklist Completed: patient identified, IV checked, risks and benefits discussed, monitors and equipment checked, pre-op evaluation and timeout performed  Epidural Patient position: sitting Prep: DuraPrep and site prepped and draped Patient monitoring: continuous pulse ox, blood pressure, heart rate and cardiac monitor Approach: midline Location: L3-L4 Injection technique: LOR air  Needle:  Needle type: Tuohy  Needle gauge: 17 G Needle length: 9 cm Needle insertion depth: 8 cm Catheter type: closed end flexible Catheter size: 19 Gauge Catheter at skin depth: 13 cm Test dose: negative  Assessment Sensory level: T8 Events: blood not aspirated, injection not painful, no injection resistance, no paresthesia and negative IV test  Additional Notes Patient identified. Risks/Benefits/Options discussed with patient including but not limited to bleeding, infection, nerve damage, paralysis, failed block, incomplete pain control, headache, blood pressure changes, nausea, vomiting, reactions to medication both or allergic, itching and postpartum back pain. Confirmed with bedside nurse the patient's most recent platelet count. Confirmed with patient that they are not currently taking any anticoagulation, have any bleeding history or any family history of bleeding disorders. Patient expressed understanding and wished to proceed. All questions were answered. Sterile technique was used throughout the entire procedure. Please see nursing notes for vital signs. Test dose was given through epidural catheter and negative prior to continuing to dose epidural or start infusion. Warning signs of high block given to the patient including shortness of breath, tingling/numbness in hands, complete motor  block, or any concerning symptoms with instructions to call for help. Patient was given instructions on fall risk and not to get out of bed. All questions and concerns addressed with instructions to call with any issues or inadequate analgesia.  Reason for block:procedure for pain

## 2021-09-02 NOTE — Progress Notes (Signed)
Labor Progress Note  Ashlee Burch is a 26 y.o. female, G2P0010, IUP at 57 weeks, presenting for IOL for CHTN on procardia 30mg  XL daily, denies HA, RUQ pain or vision changes. GBS+. LR Female  Subjective: Pt stable and comfortable post epidural placement. Pt feels pressure at times. Tolerates well.  Patient Active Problem List   Diagnosis Date Noted   HTN in pregnancy, chronic 09/02/2021   1st pregnancy missed AB due to triploidy 08/10/2020   Missed abortion 07/13/2020   Migraine    OBESITY, NOS 04/10/2006   Objective: BP 134/69   Pulse 93   Temp 98.4 F (36.9 C) (Oral)   Resp 16   Ht 5\' 3"  (1.6 m)   Wt 83.4 kg   SpO2 100%   Breastfeeding Unknown   BMI 32.56 kg/m  No intake/output data recorded. No intake/output data recorded. NST: FHR baseline 140 bpm, Variability: moderate, Accelerations:present, Decelerations: earlies present = Cat 1/Reactive CTX:  irregular, every 1.5-2 minutes, lasting 60/120 seconds Uterus gravid, soft non tender, moderate to palpate with contractions.  SVE:  Dilation: 7 Effacement (%): 90 Station: 0 Exam by:: [facility] CNM Pitocin at 10 decreased to 5 r/t cxt pattern mUn/min IUPC remains in place.    Assessment:  Ashlee Burch is a 26 y.o. female, G2P0010, IUP at 66 weeks, presenting for IOL for CHTN on procardia 30mg  XL daily, denies HA, RUQ pain or vision changes. GBS+. LR Female. Progressing in active labor.  Patient Active Problem List   Diagnosis Date Noted   HTN in pregnancy, chronic 09/02/2021   1st pregnancy missed AB due to triploidy 08/10/2020   Missed abortion 07/13/2020   Migraine    OBESITY, NOS 04/10/2006   NICHD: Category 1  Membranes: AROM, clear at 1323, no s/s of infection  IUPC: MVU 230-270  Induction:    Cytotec xN/A  Foley Bulb: N/A  Pitocin - 10, decrease to 5  Pain management:               IV pain management: x PRN  Nitrous: PRN             Epidural placement: Placed @1624  on 7/23  GBS  Positive  Abx: PCN 7/22 @ 0914, 1320  CHTN: BP 136/82, asymptomatic, PCR 0.13, cmp and cbc unremarkable, continue procardia 30mg  xl, last given 7/22. CBC redraw at 1341 7/22 plat 269.   Plan: Continue labor plan Continuous monitoring Rest Frequent position changes to facilitate fetal rotation and descent. Will reassess with cervical exam at 4 hours or earlier if necessary Continue pitocin per protocol Anticipate labor progression and vaginal delivery.   Dr 8/23 notified and aware of pt status Dr 8/22 to assume care of pt at 700pm.   , NP-C, CNM, MSN 09/02/2021. 6:26 PM

## 2021-09-02 NOTE — Anesthesia Preprocedure Evaluation (Addendum)
Anesthesia Evaluation  Patient identified by MRN, date of birth, ID band Patient awake    Reviewed: Allergy & Precautions, Patient's Chart, lab work & pertinent test results, reviewed documented beta blocker date and time   Airway Mallampati: II  TM Distance: >3 FB Neck ROM: Full    Dental no notable dental hx.    Pulmonary neg pulmonary ROS,    Pulmonary exam normal breath sounds clear to auscultation       Cardiovascular hypertension (gHTN), Pt. on medications and Pt. on home beta blockers Normal cardiovascular exam Rhythm:Regular Rate:Normal     Neuro/Psych  Headaches, negative psych ROS   GI/Hepatic negative GI ROS, Neg liver ROS,   Endo/Other  negative endocrine ROSBMI 33  Renal/GU negative Renal ROS  negative genitourinary   Musculoskeletal negative musculoskeletal ROS (+)   Abdominal   Peds negative pediatric ROS (+)  Hematology  (+) Blood dyscrasia, anemia , Hb 11.3, plt 269   Anesthesia Other Findings   Reproductive/Obstetrics (+) Pregnancy                             Anesthesia Physical Anesthesia Plan  ASA: 3  Anesthesia Plan: Epidural   Post-op Pain Management:    Induction:   PONV Risk Score and Plan: 2  Airway Management Planned: Natural Airway  Additional Equipment: None  Intra-op Plan:   Post-operative Plan:   Informed Consent: I have reviewed the patients History and Physical, chart, labs and discussed the procedure including the risks, benefits and alternatives for the proposed anesthesia with the patient or authorized representative who has indicated his/her understanding and acceptance.       Plan Discussed with:   Anesthesia Plan Comments:         Anesthesia Quick Evaluation

## 2021-09-02 NOTE — Progress Notes (Addendum)
Labor Progress Note  Ashlee Burch is a 26 y.o. female, G2P0010, IUP at 20 weeks, presenting for IOL for CHTN on procardia 30mg  XL daily, denies HA, RUQ pain or vision changes. GBS+. LR Female  Subjective: Pt stable and comfortable post epidural placement. Reviewed R/B/A of IUPC, pt consented to placement.  Patient Active Problem List   Diagnosis Date Noted   HTN in pregnancy, chronic 09/02/2021   1st pregnancy missed AB due to triploidy 08/10/2020   Missed abortion 07/13/2020   Migraine    OBESITY, NOS 04/10/2006   Objective: BP 136/76   Pulse 92   Temp 98.4 F (36.9 C) (Oral)   Resp 16   Ht 5\' 3"  (1.6 m)   Wt 83.4 kg   SpO2 100%   Breastfeeding Unknown   BMI 32.56 kg/m  No intake/output data recorded. No intake/output data recorded. NST: FHR baseline 140 bpm, Variability: moderate, Accelerations:present, Decelerations: earlies present = Cat 1/Reactive CTX:  irregular, every 1.5-2 minutes, lasting 60/120 seconds Uterus gravid, soft non tender, moderate to palpate with contractions.  SVE:  Dilation: 6 Effacement (%): 80 Station: -1 Exam by:: CNM Pitocin at 10 mUn/min IUPC placed with ease tolerated well.    Assessment:  Ashlee Burch is a 26 y.o. female, G2P0010, IUP at 45 weeks, presenting for IOL for CHTN on procardia 30mg  XL daily, denies HA, RUQ pain or vision changes. GBS+. LR Female. Progressing in active labor.  Patient Active Problem List   Diagnosis Date Noted   HTN in pregnancy, chronic 09/02/2021   1st pregnancy missed AB due to triploidy 08/10/2020   Missed abortion 07/13/2020   Migraine    OBESITY, NOS 04/10/2006   NICHD: Category 1  Membranes: AROM, clear at 1323, no s/s of infection  IUPC: MVU 230  Induction:    Cytotec xN/A  Foley Bulb: N/A  Pitocin - 10  Pain management:               IV pain management: x PRN  Nitrous: PRN             Epidural placement: Placed @1624  on 7/23  GBS Positive  Abx: PCN 7/22 @ 0914,  1320  CHTN: BP 136/76, asymptomatic, PCR 0.13, cmp and cbc unremarkable, continue procardia 30mg  xl, last given 7/22. CBC redraw at 1341 7/22 plat 269.   Plan: Continue labor plan Continuous monitoring Rest Frequent position changes to facilitate fetal rotation and descent. Will reassess with cervical exam at 4 hours or earlier if necessary Continue pitocin per protocol Anticipate labor progression and vaginal delivery.    8/23, NP-C, CNM, MSN 09/02/2021. 4:28 PM

## 2021-09-02 NOTE — Progress Notes (Signed)
Labor Progress Note  TANAI BOULER is a 26 y.o. female, G2P0010, IUP at 104 weeks, presenting for IOL for CHTN on procardia 30mg  XL daily, denies HA, RUQ pain or vision changes. GBS+. LR Female  Subjective: Pt stable feeling cxt, tolerates well, up walking around. Discussed R/B/A of AROM pt consented to AROM.  Patient Active Problem List   Diagnosis Date Noted   HTN in pregnancy, chronic 09/02/2021   1st pregnancy missed AB due to triploidy 08/10/2020   Missed abortion 07/13/2020   Migraine    OBESITY, NOS 04/10/2006   Objective: BP (!) 144/89   Pulse 81   Temp 98.4 F (36.9 C) (Oral)   Resp 16   Ht 5\' 3"  (1.6 m)   Wt 83.4 kg   Breastfeeding Unknown   BMI 32.56 kg/m  No intake/output data recorded. No intake/output data recorded. NST: FHR baseline 130 bpm, Variability: moderate, Accelerations:present, Decelerations:  Absent= Cat 1/Reactive CTX:  irregular, every 1-4 minutes, lasting 50/80 seconds Uterus gravid, soft non tender, moderate to palpate with contractions.  SVE:  Dilation: 4 Effacement (%): 70 Station: -1 Exam by:: Thompson Mckim CNM Pitocin at 8 mUn/min AROM tolerated well.   Assessment:  CAROLYNE WHITSEL is a 26 y.o. female, G2P0010, IUP at 63 weeks, presenting for IOL for CHTN on procardia 30mg  XL daily, denies HA, RUQ pain or vision changes. GBS+. LR Female. Progressing in latent labor.  Patient Active Problem List   Diagnosis Date Noted   HTN in pregnancy, chronic 09/02/2021   1st pregnancy missed AB due to triploidy 08/10/2020   Missed abortion 07/13/2020   Migraine    OBESITY, NOS 04/10/2006   NICHD: Category 1  Membranes: AROM, clear at 1323, no s/s of infection  Induction:    Cytotec xN/A  Foley Bulb: N/A  Pitocin - 8  Pain management:               IV pain management: x PRN  Nitrous: PRN             Epidural placement: PRN  GBS Positive  Abx: PCN 7/22 @ 0914  CHTN: BP 151/93, asymptomatic, PCR 0.13, cmp and cbc unremarkable, continue  procardia 30mg  xl, last given 7/22. CBC redraw at 1341 7/22 plat 269.   Plan: Continue labor plan Continuous monitoring Rest Ambulate Frequent position changes to facilitate fetal rotation and descent. Will reassess with cervical exam at 4 hours or earlier if necessary Continue pitocin per protocol Plan for epidural.  Anticipate labor progression and vaginal delivery.    8/22, NP-C, CNM, MSN 09/02/2021. 2:40 PM

## 2021-09-03 ENCOUNTER — Encounter (HOSPITAL_COMMUNITY): Payer: Self-pay | Admitting: Obstetrics & Gynecology

## 2021-09-03 LAB — CBC
HCT: 29.2 % — ABNORMAL LOW (ref 36.0–46.0)
Hemoglobin: 9.9 g/dL — ABNORMAL LOW (ref 12.0–15.0)
MCH: 28.1 pg (ref 26.0–34.0)
MCHC: 33.9 g/dL (ref 30.0–36.0)
MCV: 83 fL (ref 80.0–100.0)
Platelets: 240 10*3/uL (ref 150–400)
RBC: 3.52 MIL/uL — ABNORMAL LOW (ref 3.87–5.11)
RDW: 12.7 % (ref 11.5–15.5)
WBC: 17 10*3/uL — ABNORMAL HIGH (ref 4.0–10.5)
nRBC: 0 % (ref 0.0–0.2)

## 2021-09-03 LAB — COMPREHENSIVE METABOLIC PANEL
ALT: 11 U/L (ref 0–44)
AST: 22 U/L (ref 15–41)
Albumin: 2.2 g/dL — ABNORMAL LOW (ref 3.5–5.0)
Alkaline Phosphatase: 120 U/L (ref 38–126)
Anion gap: 7 (ref 5–15)
BUN: <5 mg/dL — ABNORMAL LOW (ref 6–20)
CO2: 21 mmol/L — ABNORMAL LOW (ref 22–32)
Calcium: 8.7 mg/dL — ABNORMAL LOW (ref 8.9–10.3)
Chloride: 107 mmol/L (ref 98–111)
Creatinine, Ser: 0.58 mg/dL (ref 0.44–1.00)
GFR, Estimated: >60 mL/min (ref >60)
Glucose, Bld: 104 mg/dL — ABNORMAL HIGH (ref 70–99)
Potassium: 3.5 mmol/L (ref 3.5–5.1)
Sodium: 135 mmol/L (ref 135–145)
Total Bilirubin: 0.8 mg/dL (ref 0.3–1.2)
Total Protein: 5.3 g/dL — ABNORMAL LOW (ref 6.5–8.1)

## 2021-09-03 MED ORDER — PRENATAL MULTIVITAMIN CH
1.0000 | ORAL_TABLET | Freq: Every day | ORAL | Status: DC
Start: 2021-09-03 — End: 2021-09-04
  Administered 2021-09-03 – 2021-09-04 (×2): 1 via ORAL
  Filled 2021-09-03 (×2): qty 1

## 2021-09-03 MED ORDER — SENNOSIDES-DOCUSATE SODIUM 8.6-50 MG PO TABS
2.0000 | ORAL_TABLET | Freq: Every day | ORAL | Status: DC
Start: 2021-09-03 — End: 2021-09-04
  Administered 2021-09-03 – 2021-09-04 (×2): 2 via ORAL
  Filled 2021-09-03 (×2): qty 2

## 2021-09-03 MED ORDER — NIFEDIPINE ER OSMOTIC RELEASE 30 MG PO TB24
30.0000 mg | ORAL_TABLET | Freq: Once | ORAL | Status: AC
Start: 2021-09-03 — End: 2021-09-03
  Administered 2021-09-03: 30 mg via ORAL
  Filled 2021-09-03: qty 1

## 2021-09-03 MED ORDER — ONDANSETRON HCL 4 MG/2ML IJ SOLN: 4.0000 mg | INTRAMUSCULAR | Status: DC | PRN

## 2021-09-03 MED ORDER — TETANUS-DIPHTH-ACELL PERTUSSIS 5-2.5-18.5 LF-MCG/0.5 IM SUSY: 0.5000 mL | PREFILLED_SYRINGE | Freq: Once | INTRAMUSCULAR | Status: DC

## 2021-09-03 MED ORDER — NIFEDIPINE ER OSMOTIC RELEASE 30 MG PO TB24
60.0000 mg | ORAL_TABLET | Freq: Every day | ORAL | Status: DC
  Administered 2021-09-04: 60 mg via ORAL
  Filled 2021-09-03: qty 2

## 2021-09-03 MED ORDER — ACETAMINOPHEN 325 MG PO TABS
650.0000 mg | ORAL_TABLET | ORAL | Status: DC | PRN
  Administered 2021-09-04 (×2): 650 mg via ORAL
  Filled 2021-09-03 (×2): qty 2

## 2021-09-03 MED ORDER — ONDANSETRON HCL 4 MG PO TABS: 4.0000 mg | ORAL_TABLET | ORAL | Status: DC | PRN

## 2021-09-03 MED ORDER — OXYCODONE-ACETAMINOPHEN 5-325 MG PO TABS: 2.0000 | ORAL_TABLET | ORAL | Status: DC | PRN

## 2021-09-03 MED ORDER — SIMETHICONE 80 MG PO CHEW: 80.0000 mg | CHEWABLE_TABLET | ORAL | Status: DC | PRN

## 2021-09-03 MED ORDER — BENZOCAINE-MENTHOL 20-0.5 % EX AERO
1.0000 | INHALATION_SPRAY | CUTANEOUS | Status: DC | PRN
  Administered 2021-09-03: 1 via TOPICAL
  Filled 2021-09-03: qty 56

## 2021-09-03 MED ORDER — WITCH HAZEL-GLYCERIN EX PADS: 1.0000 | MEDICATED_PAD | CUTANEOUS | Status: DC | PRN

## 2021-09-03 MED ORDER — ZOLPIDEM TARTRATE 5 MG PO TABS: 5.0000 mg | ORAL_TABLET | Freq: Every evening | ORAL | Status: DC | PRN

## 2021-09-03 MED ORDER — COCONUT OIL OIL: 1.0000 | TOPICAL_OIL | Status: DC | PRN

## 2021-09-03 MED ORDER — IBUPROFEN 600 MG PO TABS
600.0000 mg | ORAL_TABLET | Freq: Four times a day (QID) | ORAL | Status: DC
  Administered 2021-09-03 – 2021-09-04 (×6): 600 mg via ORAL
  Filled 2021-09-03 (×7): qty 1

## 2021-09-03 MED ORDER — DIPHENHYDRAMINE HCL 25 MG PO CAPS: 25.0000 mg | ORAL_CAPSULE | Freq: Four times a day (QID) | ORAL | Status: DC | PRN

## 2021-09-03 MED ORDER — OXYCODONE-ACETAMINOPHEN 5-325 MG PO TABS
1.0000 | ORAL_TABLET | ORAL | Status: DC | PRN
  Administered 2021-09-04: 1 via ORAL
  Filled 2021-09-03: qty 1

## 2021-09-03 MED ORDER — LACTATED RINGERS IV SOLN: INTRAVENOUS | Status: DC

## 2021-09-03 MED ORDER — DIBUCAINE (PERIANAL) 1 % EX OINT: 1.0000 | TOPICAL_OINTMENT | CUTANEOUS | Status: DC | PRN

## 2021-09-03 MED ORDER — OXYTOCIN-SODIUM CHLORIDE 30-0.9 UT/500ML-% IV SOLN: 2.5000 [IU]/h | INTRAVENOUS | Status: DC | PRN

## 2021-09-03 NOTE — Anesthesia Postprocedure Evaluation (Signed)
Anesthesia Post Note  Patient: Ashlee Burch  Procedure(s) Performed: AN AD HOC LABOR EPIDURAL     Patient location during evaluation: Mother Baby Anesthesia Type: Epidural Level of consciousness: awake and alert Pain management: pain level controlled Vital Signs Assessment: post-procedure vital signs reviewed and stable Respiratory status: spontaneous breathing, nonlabored ventilation and respiratory function stable Cardiovascular status: stable Postop Assessment: no headache, no backache and epidural receding Anesthetic complications: no   No notable events documented.  Last Vitals:  Vitals:   09/03/21 0245 09/03/21 0600  BP: (!) 144/84 139/85  Pulse: 80 87  Resp: 20 20  Temp: 36.9 C 36.8 C  SpO2: 100% 100%    Last Pain:  Vitals:   09/03/21 0600  TempSrc: Oral  PainSc: 0-No pain   Pain Goal: Patients Stated Pain Goal: 0 (09/02/21 1521)                 Anahit Klumb

## 2021-09-03 NOTE — Progress Notes (Signed)
Post Partum Day 1 Subjective: no complaints, up ad lib, voiding, and tolerating PO  Objective: Blood pressure 132/72, pulse 88, temperature 98.4 F (36.9 C), temperature source Oral, resp. rate 20, height 5\' 3"  (1.6 m), weight 83.4 kg, SpO2 100 %, unknown if currently breastfeeding.  Physical Exam:  General: alert and no distress Lochia: appropriate Uterine Fundus: firm, nt Incision: n/a DVT Evaluation: no calf tenderness  Recent Labs    09/02/21 1341 09/03/21 0441  HGB 11.3* 9.9*  HCT 32.0* 29.2*    Assessment/Plan: Plan for discharge tomorrow and Breastfeeding Doing well Circ not done d/t baby with CBG issues, plan for circ tomorrow CHTN on procardi xl 30mg , BPs elevated, increase procardia xl to 60mg  daily   LOS: 1 day   09/05/21, MD 09/03/2021, 2:59 PM

## 2021-09-03 NOTE — Lactation Note (Signed)
This note was copied from a baby's chart. Lactation Consultation Note  Patient Name: Boy Margert Edsall [id number] Date: 09/03/2021 Reason for consult: Follow-up assessment;1st time breastfeeding;Early term 37-38.6wks;Infant < 6lbs Age:27 hours  P1, Waiting on blood sugar results.  Attempted latching but baby became spitty after 30 ml of formula.  Returned to room to assist with latching due to baby cueing.  Assisted with cross cradle.  Baby would latch off and on for a few sucks and became sleepy. Due to BS issues, baby was offered formula. Reviewed volume guidelines and offered to set up Mother with pumping.  Mother will call for further assistance today as needed. Reviewed LPI feeding plan.  Maternal Data Has patient been taught Hand Expression?: Yes Does the patient have breastfeeding experience prior to this delivery?: No  Feeding Mother's Current Feeding Choice: Breast Milk and Formula Nipple Type: Slow - flow  Interventions Interventions: Assisted with latch;Skin to skin;Hand express;Education Consult Status Consult Status: Follow-up Date: 09/03/21 Follow-up type: In-patient    Dahlia Byes [facility] 09/03/2021, 10:39 AM

## 2021-09-03 NOTE — Progress Notes (Signed)
Notified Dr. Mora Appl of pt's BP since arriving to floor. 154/96 at 0205, 144/84 at 0245. Call MD if BP >160/110.

## 2021-09-03 NOTE — Lactation Note (Signed)
This note was copied from a baby's chart. Lactation Consultation Note  Patient Name: Ashlee Burch [id number] Date: 09/03/2021 Reason for consult: Follow-up assessment;1st time breastfeeding;Early term 37-38.6wks;Infant < 6lbs Age:27 hours  Mother holding baby STS.  Set up DEBP and informed [name] RN that mother plans to start pumping later today and may need review. Baby's blood sugar WNL.  Maternal Data Has patient been taught Hand Expression?: Yes Does the patient have breastfeeding experience prior to this delivery?: No  Feeding Mother's Current Feeding Choice: Breast Milk and Formula Nipple Type: Slow - flow  Lactation Tools Discussed/Used Tools: Pump Breast pump type: Double-Electric Breast Pump;Manual Pump Education: Setup, frequency, and cleaning;Milk Storage Reason for Pumping: stimulation and supplementation  Interventions Interventions: Assisted with latch;Skin to skin;Hand express;Education  Discharge Pump: Personal;DEBP;Hands Free ([place] DEBP and Elvie Hands free)  Consult Status Consult Status: Follow-up Date: 09/04/21 Follow-up type: In-patient    Ashlee Burch [facility] 09/03/2021, 11:47 AM

## 2021-09-03 NOTE — Lactation Note (Signed)
This note was copied from a baby's chart. Lactation Consultation Note Mom had baby on the breast BF when LC came into rm. Baby suckling well at intervals. Baby came off of breast several times while LC in rm. LC assisted in re-latching baby. Placed support under baby's head. Mom denies painful latch. Stated breast got a little bigger during pregnancy.  Mom getting sleeping during feeding. Suggested family watch mom while she is feeding. Mom will be f/u by Lactation today.  Patient Name: Ashlee Burch [id number] Date: 09/03/2021 Reason for consult: L&D Initial assessment;Primapara;Early term 37-38.6wks Age:63 hours  Maternal Data Does the patient have breastfeeding experience prior to this delivery?: No  Feeding    LATCH Score Latch: Grasps breast easily, tongue down, lips flanged, rhythmical sucking.  Audible Swallowing: None  Type of Nipple: Everted at rest and after stimulation  Comfort (Breast/Nipple): Soft / non-tender  Hold (Positioning): Assistance needed to correctly position infant at breast and maintain latch.  LATCH Score: 7   Lactation Tools Discussed/Used    Interventions Interventions: Assisted with latch;Support pillows;Skin to skin;Breast compression  Discharge    Consult Status Consult Status: Follow-up from L&D Date: 09/03/21 Follow-up type: In-patient    Charyl Dancer 09/03/2021, 12:15 AM

## 2021-09-04 ENCOUNTER — Other Ambulatory Visit (HOSPITAL_COMMUNITY): Payer: Self-pay

## 2021-09-04 DIAGNOSIS — D62 Acute posthemorrhagic anemia: Secondary | ICD-10-CM | POA: Diagnosis not present

## 2021-09-04 MED ORDER — OXYCODONE-ACETAMINOPHEN 5-325 MG PO TABS
1.0000 | ORAL_TABLET | ORAL | 0 refills | Status: AC | PRN
  Filled 2021-09-04: qty 30, 5d supply, fill #0

## 2021-09-04 MED ORDER — NIFEDIPINE ER 60 MG PO TB24
60.0000 mg | ORAL_TABLET | Freq: Every day | ORAL | 3 refills | Status: AC
  Filled 2021-09-04: qty 30, 30d supply, fill #0

## 2021-09-04 MED ORDER — SENNOSIDES-DOCUSATE SODIUM 8.6-50 MG PO TABS
2.0000 | ORAL_TABLET | Freq: Two times a day (BID) | ORAL | 3 refills | Status: AC | PRN
Start: 2021-09-04 — End: ?
  Filled 2021-09-04: qty 30, 8d supply, fill #0

## 2021-09-04 MED ORDER — IBUPROFEN 600 MG PO TABS
600.0000 mg | ORAL_TABLET | Freq: Four times a day (QID) | ORAL | 3 refills | Status: AC
  Filled 2021-09-04: qty 60, 15d supply, fill #0

## 2021-09-04 NOTE — Lactation Note (Signed)
This note was copied from a baby's chart. Lactation Consultation Note  Patient Name: Boy Caress Reffitt [id number] Date: 09/04/2021 Reason for consult: Follow-up assessment;1st time breastfeeding;Infant < 6lbs;Infant weight loss;Other (Comment);Early term 37-38.6wks (4 % weight loss, per mom the baby has recently fed. baby asleep. LC reviewed BF D/C teaching for ET, > 6 pound infant. LC recommended until the baby weight is over 6 pounds and gaining well to feed for 15 - , supplement 30 ml of EBM or formula.) Age:14 hours After baby is settled - post pump both breast 15 mins , save milk for the next feeding. Mom receptive to F/U with LC O/P at [facility].  Maternal Data Has patient been taught Hand Expression?: Yes  Feeding Mother's Current Feeding Choice: Breast Milk and Formula Nipple Type: Slow - flow  LATCH Score                    Lactation Tools Discussed/Used Breast pump type: Double-Electric Breast Pump;Manual;Other (comment) (LC reviewed) Pump Education: Milk Storage;Setup, frequency, and cleaning  Interventions    Discharge Discharge Education: Engorgement and breast care;Warning signs for feeding baby;Outpatient recommendation;Outpatient Epic message sent;Other (comment) (mom aware she will receive a call) Pump: DEBP;Personal;Hands Free [facility] Program: Yes (per mom has been contacted by [facility] for appt)  Consult Status Consult Status: Complete Date: 09/04/21    Kathrin Greathouse 09/04/2021, 8:29 AM

## 2021-09-04 NOTE — Discharge Summary (Signed)
[facility] Discharge Summary   Patient Name:   Ashlee Burch DOB:     [birth year] MRN:     [id number]  Date of Admission:   09/02/2021 Date of Discharge:  09/04/2021  Admitting diagnosis:    HTN in pregnancy, chronic [O10.919] Principal Problem:   HTN in pregnancy, chronic Active Problems:   Acute blood loss anemia (ABLA)   SVD (spontaneous vaginal delivery)     Discharge diagnosis:    HTN in pregnancy, chronic [O10.919] Principal Problem:   HTN in pregnancy, chronic Active Problems:   Acute blood loss anemia (ABLA)   SVD (spontaneous vaginal delivery)  Term Pregnancy Delivered        Additional problems: None                                          Post partum procedures: none Augmentation: AROM and Pitocin Complications: None  Hospital course: Induction of Labor With Vaginal Delivery   26 y.o. yo G2P1011 at 29w0dwas admitted to the hospital 09/02/2021 for induction of labor.  Indication for induction:  chronic hypertension .  Patient had an uncomplicated labor course as follows: Membrane Rupture Time/Date: 1:23 PM ,09/02/2021   Delivery Method:Vaginal, Spontaneous  Episiotomy:   Lacerations:  None  Details of delivery can be found in separate delivery note.  Patient had a routine postpartum course. Patient is discharged home 09/04/21.  Newborn Data: Birth date:09/02/2021  Birth time:11:04 PM  Gender:Female  Living status:Living  Apgars:7 ,9  Weight:2720 g   Magnesium Sulfate received: No BMZ received: No Rhophylac:No MMR:N/A T-DaP:Given prenatally Flu: No Transfusion:No                                                               Type of Delivery:  NSVD Delivering Provider: PSanjuana Kava Date of Delivery:  09/02/21  Newborn Data:  Baby Feeding:   Bottle and Breast Disposition:   home with mother  Physical Exam:   Vitals:   09/03/21 1452 09/03/21 1945 09/04/21 0442 09/04/21 1100  BP: 132/72 136/85 139/88 135/84  Pulse: 88 97 84 87   Resp: 20 18 16    Temp: 98.4 F (36.9 C) 98.2 F (36.8 C) 98.4 F (36.9 C)   TempSrc: Oral Oral    SpO2: 100%   100%  Weight:      Height:       General: alert, cooperative, and no distress Lochia: appropriate Uterine Fundus: firm Incision: N/A DVT Evaluation: No evidence of DVT seen on physical exam. Negative Homan's sign. No cords or calf tenderness.  Labs: Lab Results  Component Value Date   WBC 17.0 (H) 09/03/2021   HGB 9.9 (L) 09/03/2021   HCT 29.2 (L) 09/03/2021   MCV 83.0 09/03/2021   PLT 240 09/03/2021      Latest Ref Rng & Units 09/03/2021    4:41 AM  CMP  Glucose 70 - 99 mg/dL 104   BUN 6 - 20 mg/dL <5   Creatinine 0.44 - 1.00 mg/dL 0.58   Sodium 135 - 145 mmol/L 135   Potassium 3.5 - 5.1 mmol/L 3.5   Chloride 98 - 111 mmol/L 107  CO2 22 - 32 mmol/L 21   Calcium 8.9 - 10.3 mg/dL 8.7   Total Protein 6.5 - 8.1 g/dL 5.3   Total Bilirubin 0.3 - 1.2 mg/dL 0.8   Alkaline Phos 38 - 126 U/L 120   AST 15 - 41 U/L 22   ALT 0 - 44 U/L 11     Discharge instruction: per After Visit Summary and "Baby and Me Booklet".  After Visit Meds:  Allergies as of 09/04/2021   No Known Allergies      Medication List     STOP taking these medications    labetalol 100 MG tablet Commonly known as: NORMODYNE       TAKE these medications    ibuprofen 600 MG tablet Commonly known as: ADVIL Take 1 tablet (600 mg total) by mouth every 6 (six) hours. What changed:  medication strength how much to take when to take this reasons to take this   NIFEdipine 60 MG 24 hr tablet Commonly known as: ADALAT CC Take 1 tablet (60 mg total) by mouth daily. Start taking on: September 05, 2021   oxyCODONE-acetaminophen 5-325 MG tablet Commonly known as: PERCOCET/ROXICET Take 1 tablet by mouth every 4 (four) hours as needed for severe pain (pain scale 4-7).   prenatal multivitamin Tabs tablet Take 1 tablet by mouth daily at 12 noon.   senna-docusate 8.6-50 MG tablet Commonly  known as: Senokot-S Take 2 tablets by mouth 2 (two) times daily as needed for mild constipation.        Diet: low salt diet  Activity: Advance as tolerated. Pelvic rest for 6 weeks.   Outpatient follow up:1 week Follow up Appt:No future appointments. Follow up visit: No follow-ups on file.  Postpartum contraception: Undecided  09/04/2021 Sanjuana Kava, MD

## 2021-09-11 ENCOUNTER — Ambulatory Visit (HOSPITAL_COMMUNITY)
Admission: EM | Admit: 2021-09-11 | Discharge: 2021-09-11 | Disposition: A | Discharge status: Home / Self Care | Payer: Medicaid Other

## 2021-09-11 ENCOUNTER — Encounter (HOSPITAL_COMMUNITY): Payer: Self-pay

## 2021-09-11 ENCOUNTER — Encounter (HOSPITAL_COMMUNITY): Payer: Self-pay | Admitting: Obstetrics & Gynecology

## 2021-09-11 ENCOUNTER — Telehealth (HOSPITAL_COMMUNITY): Payer: Self-pay | Admitting: *Deleted

## 2021-09-11 ENCOUNTER — Inpatient Hospital Stay (HOSPITAL_COMMUNITY)
Admission: AD | Admit: 2021-09-11 | Discharge: 2021-09-11 | Disposition: A | Discharge status: Home / Self Care | Payer: Medicaid Other | Attending: Obstetrics & Gynecology | Admitting: Obstetrics & Gynecology

## 2021-09-11 DIAGNOSIS — M25471 Effusion, right ankle: Secondary | ICD-10-CM | POA: Diagnosis not present

## 2021-09-11 DIAGNOSIS — O165 Unspecified maternal hypertension, complicating the puerperium: Secondary | ICD-10-CM

## 2021-09-11 DIAGNOSIS — M25474 Effusion, right foot: Secondary | ICD-10-CM | POA: Diagnosis not present

## 2021-09-11 DIAGNOSIS — M7989 Other specified soft tissue disorders: Secondary | ICD-10-CM | POA: Insufficient documentation

## 2021-09-11 DIAGNOSIS — I10 Essential (primary) hypertension: Secondary | ICD-10-CM

## 2021-09-11 DIAGNOSIS — O1093 Unspecified pre-existing hypertension complicating the puerperium: Secondary | ICD-10-CM | POA: Insufficient documentation

## 2021-09-11 DIAGNOSIS — M25472 Effusion, left ankle: Secondary | ICD-10-CM | POA: Diagnosis not present

## 2021-09-11 DIAGNOSIS — M25475 Effusion, left foot: Secondary | ICD-10-CM

## 2021-09-11 LAB — COMPREHENSIVE METABOLIC PANEL
ALT: 21 U/L (ref 0–44)
AST: 20 U/L (ref 15–41)
Albumin: 3.3 g/dL — ABNORMAL LOW (ref 3.5–5.0)
Alkaline Phosphatase: 91 U/L (ref 38–126)
Anion gap: 5 (ref 5–15)
BUN: 13 mg/dL (ref 6–20)
CO2: 22 mmol/L (ref 22–32)
Calcium: 8.9 mg/dL (ref 8.9–10.3)
Chloride: 107 mmol/L (ref 98–111)
Creatinine, Ser: 0.71 mg/dL (ref 0.44–1.00)
GFR, Estimated: >60 mL/min (ref >60)
Glucose, Bld: 87 mg/dL (ref 70–99)
Potassium: 3.6 mmol/L (ref 3.5–5.1)
Sodium: 134 mmol/L — ABNORMAL LOW (ref 135–145)
Total Bilirubin: 0.5 mg/dL (ref 0.3–1.2)
Total Protein: 6.7 g/dL (ref 6.5–8.1)

## 2021-09-11 LAB — CBC
HCT: 34.2 % — ABNORMAL LOW (ref 36.0–46.0)
Hemoglobin: 11.5 g/dL — ABNORMAL LOW (ref 12.0–15.0)
MCH: 27.9 pg (ref 26.0–34.0)
MCHC: 33.6 g/dL (ref 30.0–36.0)
MCV: 83 fL (ref 80.0–100.0)
Platelets: 407 10*3/uL — ABNORMAL HIGH (ref 150–400)
RBC: 4.12 MIL/uL (ref 3.87–5.11)
RDW: 13.2 % (ref 11.5–15.5)
WBC: 9.2 10*3/uL (ref 4.0–10.5)
nRBC: 0 % (ref 0.0–0.2)

## 2021-09-11 MED ORDER — FUROSEMIDE 20 MG PO TABS
20.0000 mg | ORAL_TABLET | Freq: Once | ORAL | Status: AC
  Administered 2021-09-11: 20 mg via ORAL
  Filled 2021-09-11: qty 1

## 2021-09-11 MED ORDER — FUROSEMIDE 20 MG PO TABS: 20.0000 mg | ORAL_TABLET | Freq: Every day | ORAL | 0 refills | Status: AC

## 2021-09-11 NOTE — Telephone Encounter (Signed)
Patient reported that her legs remain swollen. Reported no decrease in swelling. Denied headaches, blurred vision. RN encouraged patient to contact her OB office for follow-up.  Patient voiced no other questions or concerns regarding her health at this time. EPDS=0. Patient voiced no questions or concerns regarding infant at this time. Patient reports infant sleeps in a bassinet on his back. RN reviewed ABCs of safe sleep. Patient verbalized understanding. Patient requested RN email information on hospital's virtual postpartum classes and support groups. Email sent. Deforest Hoyles, RN, 09/11/21, [phone]

## 2021-09-11 NOTE — MAU Note (Addendum)
.  Ashlee Burch is a 26 y.o. at Unknown here in MAU reporting swelling of both feet since svd on 09/02/21. Denies any other complaints. No vag. bleeding. Feet are sore but only hurt when on them. If sitting with feet elevated they are ok.   Onset of complaint: 09/02/21 Pain score: 5 Vitals:   09/11/21 2031 09/11/21 2033  BP:  (!) 142/92  Pulse: 77   Resp: 16   Temp: 97.9 F (36.6 C)   SpO2: 100%      FHT:n/a Lab orders placed from triage: none

## 2021-09-11 NOTE — MAU Provider Note (Signed)
History     CSN: [id number]  Arrival date and time: 09/11/21 2022   Event Date/Time   First Provider Initiated Contact with Patient 09/11/21 2126      Chief Complaint  Patient presents with   Foot Swelling   Ashlee Burch, a  26 y.o. G2P1011 at Unknown presents to MAU with complaints of Bilateral swelling of the feet and ankles. Patient noticed 09/06/21 after delivery. She denies pain.  Endorses improvement with elevation. States "its worse when I have been on them all day." Endorses water intake. Denies PreE symptoms. Endorses cHTN 60mg  per day. She states she has compression sock, but has not used them. She has no other complaints.      OB History     Gravida  2   Para  1   Term  1   Preterm      AB  1   Living  1      SAB      IAB      Ectopic      Multiple  0   Live Births  1           Past Medical History:  Diagnosis Date   Carrier of spinal muscular atrophy    Chronic hypertension affecting pregnancy 06/26/2020   GBS bacteriuria 06/30/2020   Hypertension    followed by pcp   Migraine    Missed ab 07/13/2020   Wears glasses     Past Surgical History:  Procedure Laterality Date   DILATION AND EVACUATION N/A 07/18/2020   Procedure: DILATATION AND EVACUATION;  Surgeon: 09/17/2020, MD;  Location: [facility];  Service: Gynecology;  Laterality: N/A;   NO PAST SURGERIES     WISDOM TOOTH EXTRACTION  teen    Family History  Problem Relation Age of Onset   Hypertension Mother     Social History   Tobacco Use   Smoking status: Never   Smokeless tobacco: Never  Vaping Use   Vaping Use: Never used  Substance Use Topics   Alcohol use: No   Drug use: Never    Allergies: No Known Allergies  Medications Prior to Admission  Medication Sig Dispense Refill Last Dose   ibuprofen (ADVIL) 600 MG tablet Take 1 tablet (600 mg total) by mouth every 6 (six) hours. 60 tablet 3 09/11/2021   NIFEdipine (ADALAT CC) 60 MG 24 hr tablet  Take 1 tablet (60 mg total) by mouth daily. 30 tablet 3 09/11/2021   Prenatal Vit-Fe Fumarate-FA (PRENATAL MULTIVITAMIN) TABS tablet Take 1 tablet by mouth daily at 12 noon.   09/11/2021   oxyCODONE-acetaminophen (PERCOCET/ROXICET) 5-325 MG tablet Take 1 tablet by mouth every 4 (four) hours as needed for severe pain (pain scale 4-7). 30 tablet 0    senna-docusate (SENOKOT-S) 8.6-50 MG tablet Take 2 tablets by mouth 2 (two) times daily as needed for mild constipation. 30 tablet 3     Review of Systems  Constitutional:  Negative for chills, fatigue and fever.  Respiratory:  Negative for apnea, shortness of breath and wheezing.   Cardiovascular:  Positive for leg swelling. Negative for chest pain and palpitations.  Gastrointestinal:  Negative for abdominal pain.  Musculoskeletal:  Negative for gait problem and joint swelling.  Skin:  Negative for color change and rash.  Neurological:  Negative for dizziness and weakness.   Physical Exam   Blood pressure (!) 142/92, pulse 77, temperature 97.9 F (36.6 C), resp. rate 16, height 5\' 4"  (  1.626 m), weight 80.3 kg, SpO2 100 %, currently breastfeeding.  Physical Exam Vitals and nursing note reviewed.  Constitutional:      General: She is not in acute distress.    Appearance: Normal appearance.  HENT:     Head: Normocephalic.  Pulmonary:     Effort: Pulmonary effort is normal.  Musculoskeletal:        General: Swelling present. No tenderness.     Cervical back: Normal range of motion.     Right lower leg: Edema present.     Left lower leg: Edema present.     Comments: Bilateral non-pitting pedal and ankle edema.   Skin:    General: Skin is warm and dry.  Neurological:     Mental Status: She is alert and oriented to person, place, and time.  Psychiatric:        Mood and Affect: Mood normal.     MAU Course  Procedures Lab Orders         CBC         Comprehensive metabolic panel    Results for orders placed or performed during the  hospital encounter of 09/11/21 (from the past 24 hour(s))  CBC     Status: Abnormal   Collection Time: 09/11/21  9:41 PM  Result Value Ref Range   WBC 9.2 4.0 - 10.5 K/uL   RBC 4.12 3.87 - 5.11 MIL/uL   Hemoglobin 11.5 (L) 12.0 - 15.0 g/dL   HCT 36.6 (L) 29.4 - 76.5 %   MCV 83.0 80.0 - 100.0 fL   MCH 27.9 26.0 - 34.0 pg   MCHC 33.6 30.0 - 36.0 g/dL   RDW 46.5 03.5 - 46.5 %   Platelets 407 (H) 150 - 400 K/uL   nRBC 0.0 0.0 - 0.2 %  Comprehensive metabolic panel     Status: Abnormal   Collection Time: 09/11/21  9:41 PM  Result Value Ref Range   Sodium 134 (L) 135 - 145 mmol/L   Potassium 3.6 3.5 - 5.1 mmol/L   Chloride 107 98 - 111 mmol/L   CO2 22 22 - 32 mmol/L   Glucose, Bld 87 70 - 99 mg/dL   BUN 13 6 - 20 mg/dL   Creatinine, Ser 6.81 0.44 - 1.00 mg/dL   Calcium 8.9 8.9 - 27.5 mg/dL   Total Protein 6.7 6.5 - 8.1 g/dL   Albumin 3.3 (L) 3.5 - 5.0 g/dL   AST 20 15 - 41 U/L   ALT 21 0 - 44 U/L   Alkaline Phosphatase 91 38 - 126 U/L   Total Bilirubin 0.5 0.3 - 1.2 mg/dL   GFR, Estimated >17 >00 mL/min   Anion gap 5 5 - 15  Patient Vitals for the past 24 hrs:  BP Temp Pulse Resp SpO2 Height Weight  09/11/21 2246 (!) 149/95 -- 81 -- -- -- --  09/11/21 2216 (!) 142/90 -- 80 -- -- -- --  09/11/21 2201 (!) 135/103 -- 76 -- -- -- --  09/11/21 2146 (!) 142/77 -- 75 -- -- -- --  09/11/21 2131 (!) 153/95 -- 83 -- -- -- --  09/11/21 2121 (!) 149/91 -- 72 -- -- -- --  09/11/21 2033 (!) 142/92 -- -- -- -- -- --  09/11/21 2031 -- 97.9 F (36.6 C) 77 16 100 % 5\' 4"  (1.626 m) 80.3 kg   Meds ordered this encounter  Medications   furosemide (LASIX) tablet 20 mg   furosemide (LASIX) 20 MG tablet  Sig: Take 1 tablet (20 mg total) by mouth daily.    Dispense:  4 tablet    Refill:  0    Order Specific Question:   Supervising Provider    Answer:   Samara Snide   MDM Lab results reviewed and interpreted by me.   - BPs elevated in triage, but not severe range. Patient is cHTN  on 60mg  PO Procardia.  - Serum Creatinine normal  - Liver function normal  - Low suspicion for PreE - No pain, erythema, or heat in LE. Swelling improves with rest and elevation. D/t bilateral swelling, no suspicion of DVT or thrombosis.  - Consulted Dr. for presentation and 5 day Lasix regime for Postpartum swelling. Per MD 20mg  PO Lasix daily for 5 days.     Assessment and Plan   1. Bilateral swelling of feet and ankles   2. Chronic hypertension   3. Postpartum care and examination   - Discussed that this can be normal postpartum swelling.  - Recommended to continue to rest and elevate feet and ankles at night. Also encouraged the use of compression stockings.   - Recommended 5 days of PO lasix for postpartum swelling. First dose given in MAU, 4 days sent to outpatient pharmacy. Advised to increase fluid intake while on lasix.  - Worsening signs and return symptoms reviewed.  - Patient stable upon discharge and may return to MAU as needed.    Macon Large, MSN CNM  09/11/2021, 9:26 PM

## 2021-09-11 NOTE — ED Provider Notes (Signed)
[facility]    CSN: [id number] Arrival date & time: 09/11/21  1844      History   Chief Complaint Chief Complaint  Patient presents with   Foot Swelling    HPI Ashlee Burch is a 26 y.o. female.   Pt is 9 days postpartum, vaginal delivery.  She reports she was induced due to elevated blood pressure.  No preeclampsia noted.  She reports since delivery she has had persistent swelling to bilateral lower extremities.  She is currently taking nifedipine.  She denies chest pain, shortness of breath.  Reports she is not experiencing vaginal bleeding.      Past Medical History:  Diagnosis Date   Carrier of spinal muscular atrophy    Chronic hypertension affecting pregnancy 06/26/2020   GBS bacteriuria 06/30/2020   Hypertension    followed by pcp   Migraine    Missed ab 07/13/2020   Wears glasses     Patient Active Problem List   Diagnosis Date Noted   Acute blood loss anemia (ABLA) 09/04/2021   HTN in pregnancy, chronic 09/02/2021   SVD (spontaneous vaginal delivery) 09/02/2021   Migraine    OBESITY, NOS 04/10/2006    Past Surgical History:  Procedure Laterality Date   DILATION AND EVACUATION N/A 07/18/2020   Procedure: DILATATION AND EVACUATION;  Surgeon: Jerene Bears, MD;  Location: [facility];  Service: Gynecology;  Laterality: N/A;   NO PAST SURGERIES     WISDOM TOOTH EXTRACTION  [age]    OB History     Gravida  2   Para  1   Term  1   Preterm      AB  1   Living  1      SAB      IAB      Ectopic      Multiple  0   Live Births  1            Home Medications    Prior to Admission medications   Medication Sig Start Date End Date Taking? Authorizing Provider  ibuprofen (ADVIL) 600 MG tablet Take 1 tablet (600 mg total) by mouth every 6 (six) hours. 09/04/21  Yes Essie Hart, MD  NIFEdipine (ADALAT CC) 60 MG 24 hr tablet Take 1 tablet (60 mg total) by mouth daily. 09/05/21  Yes Essie Hart, MD   oxyCODONE-acetaminophen (PERCOCET/ROXICET) 5-325 MG tablet Take 1 tablet by mouth every 4 (four) hours as needed for severe pain (pain scale 4-7). 09/04/21   Essie Hart, MD  Prenatal Vit-Fe Fumarate-FA (PRENATAL MULTIVITAMIN) TABS tablet Take 1 tablet by mouth daily at 12 noon. Patient not taking: No sig reported    [provider]  senna-docusate (SENOKOT-S) 8.6-50 MG tablet Take 2 tablets by mouth 2 (two) times daily as needed for mild constipation. 09/04/21   Essie Hart, MD    Family History Family History  Problem Relation Age of Onset   Hypertension Mother     Social History Social History   Tobacco Use   Smoking status: Never   Smokeless tobacco: Never  Vaping Use   Vaping Use: Never used  Substance Use Topics   Alcohol use: No   Drug use: Never     Allergies   Patient has no known allergies.   Review of Systems Review of Systems  Constitutional:  Negative for chills and fever.  HENT:  Negative for ear pain and sore throat.   Eyes:  Negative for pain  and visual disturbance.  Respiratory:  Negative for cough and shortness of breath.   Cardiovascular:  Positive for leg swelling. Negative for chest pain and palpitations.  Gastrointestinal:  Negative for abdominal pain and vomiting.  Genitourinary:  Negative for dysuria and hematuria.  Musculoskeletal:  Negative for arthralgias and back pain.  Skin:  Negative for color change and rash.  Neurological:  Negative for seizures and syncope.  All other systems reviewed and are negative.    Physical Exam Triage Vital Signs ED Triage Vitals  Enc Vitals Group     BP 09/11/21 1927 (!) 160/100     Pulse Rate 09/11/21 1927 86     Resp 09/11/21 1927 16     Temp 09/11/21 1927 98 F (36.7 C)     Temp Source 09/11/21 1927 Oral     SpO2 09/11/21 1927 98 %     Weight 09/11/21 1929 174 lb (78.9 kg)     Height 09/11/21 1929 5\' 4"  (1.626 m)     Head Circumference --      Peak Flow --      Pain Score 09/11/21 1928  6     Pain Loc --      Pain Edu? --      Excl. in GC? --    No data found.  Updated Vital Signs BP (!) 160/100 (BP Location: Right Arm)   Pulse 86   Temp 98 F (36.7 C) (Oral)   Resp 16   Ht 5\' 4"  (1.626 m)   Wt 174 lb (78.9 kg)   SpO2 98%   Breastfeeding Yes   BMI 29.87 kg/m   Visual Acuity Right Eye Distance:   Left Eye Distance:   Bilateral Distance:    Right Eye Near:   Left Eye Near:    Bilateral Near:     Physical Exam Vitals and nursing note reviewed.  Constitutional:      General: She is not in acute distress.    Appearance: She is well-developed.  HENT:     Head: Normocephalic and atraumatic.  Eyes:     Conjunctiva/sclera: Conjunctivae normal.  Cardiovascular:     Rate and Rhythm: Normal rate and regular rhythm.     Heart sounds: No murmur heard. Pulmonary:     Effort: Pulmonary effort is normal. No respiratory distress.     Breath sounds: Normal breath sounds.  Abdominal:     Palpations: Abdomen is soft.     Tenderness: There is no abdominal tenderness.  Musculoskeletal:        General: No swelling.     Cervical back: Neck supple.     Right lower leg: Swelling present.     Left lower leg: Swelling present.  Skin:    General: Skin is warm and dry.     Capillary Refill: Capillary refill takes less than 2 seconds.  Neurological:     Mental Status: She is alert.  Psychiatric:        Mood and Affect: Mood normal.      UC Treatments / Results  Labs (all labs ordered are listed, but only abnormal results are displayed) Labs Reviewed - No data to display  EKG   Radiology No results found.  Procedures Procedures (including critical care time)  Medications Ordered in UC Medications - No data to display  Initial Impression / Assessment and Plan / UC Course  I have reviewed the triage vital signs and the nursing notes.  Pertinent labs & imaging results that were  available during my care of the patient were reviewed by me and considered  in my medical decision making (see chart for details).     Given elevated blood pressure and bilateral lower extremities 9 days postpartum advised evaluation in MAU for stat labs and evaluation.  Pt agrees with plan.  Final Clinical Impressions(s) / UC Diagnoses   Final diagnoses:  Swelling of lower extremity  Postpartum hypertension     Discharge Instructions      Go to MAU for further evaluation    ED Prescriptions   None    PDMP not reviewed this encounter.   Ward, Tylene Fantasia, PA-C 09/11/21 2031

## 2021-09-11 NOTE — ED Triage Notes (Signed)
Bilateral foot swelling since giving birth 09/02/21. Pain in the ankles of the feet. Swelling at the top of both feet going up to both ankles.   No pitting edema.

## 2021-09-11 NOTE — Progress Notes (Signed)
Written and verbal d/c instructions given and understanding voiced. 

## 2021-09-11 NOTE — Discharge Instructions (Signed)
Go to MAU for further evaluation

## 2022-06-01 IMAGING — US US OB < 14 WEEKS - US OB TV
1 series · 15 of 28 positions shown · non-contrast
Comparison: None.

CLINICAL DATA: Rule out twin gestation.

EXAM:
OBSTETRIC <14 WK US AND TRANSVAGINAL OB US
TECHNIQUE: Both transabdominal and transvaginal ultrasound examinations were
performed for complete evaluation of the gestation as well as the
maternal uterus, adnexal regions, and pelvic cul-de-sac.
Transvaginal technique was performed to assess early pregnancy.

[Series 1: us ob < 14 weeks - us ob tv · 57 acquisitions, 15 frames shown]
[im 1/57]
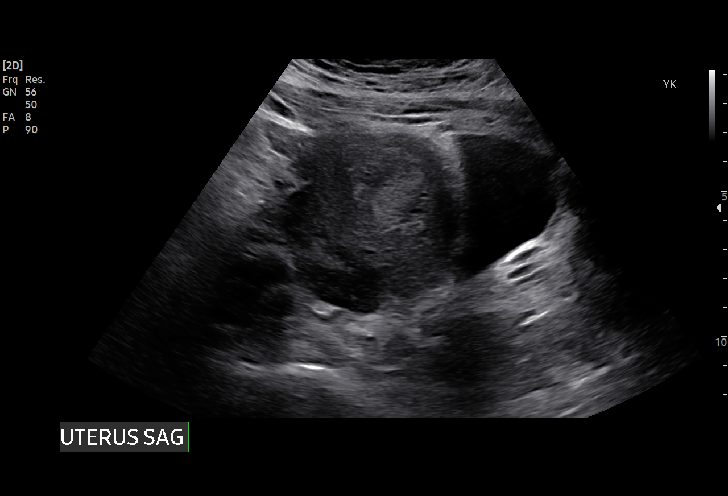
[im 5/57]
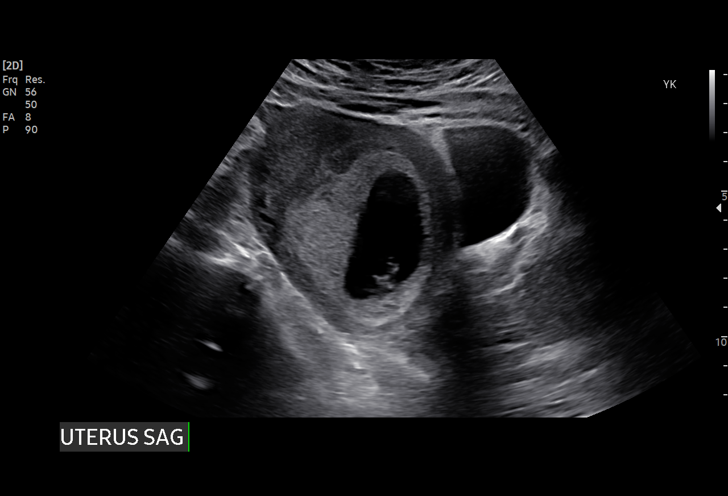
[im 9/57]
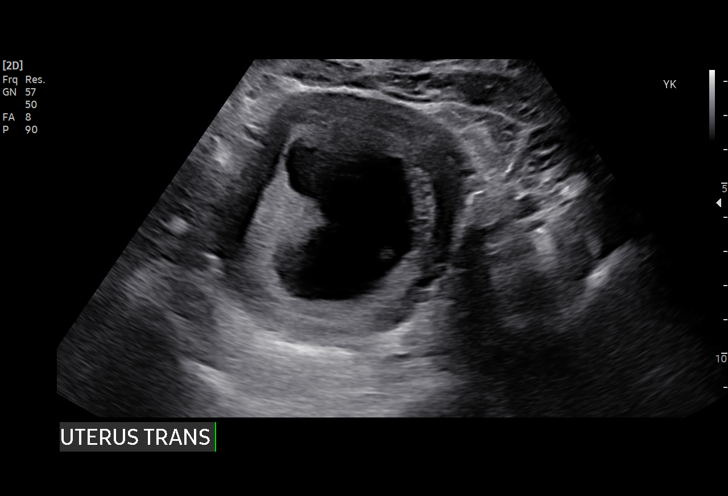
[im 13/57]
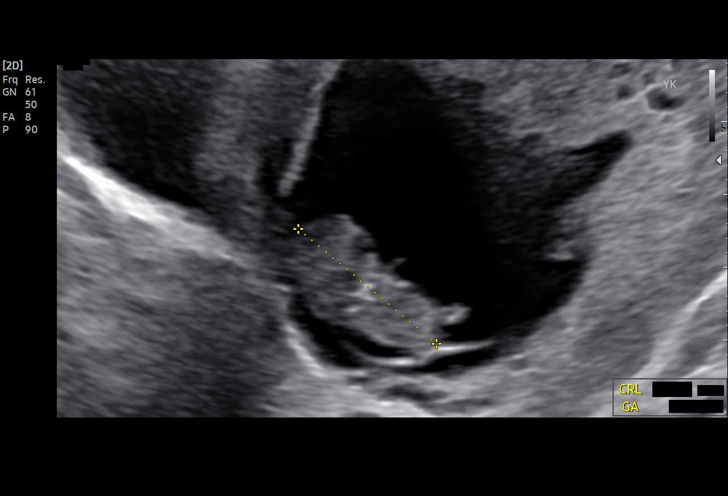
[im 17/57]
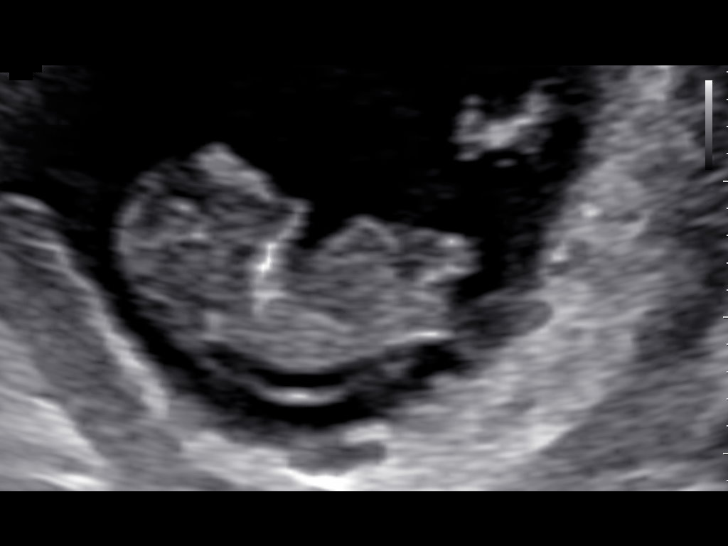
[im 21/57]
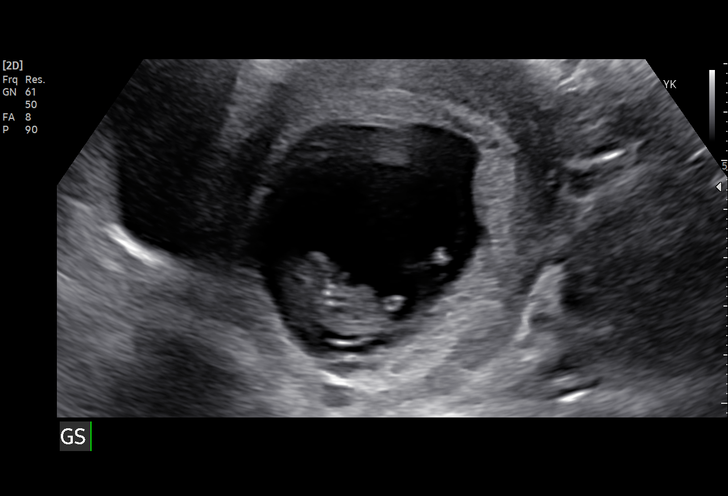
[im 25/57]
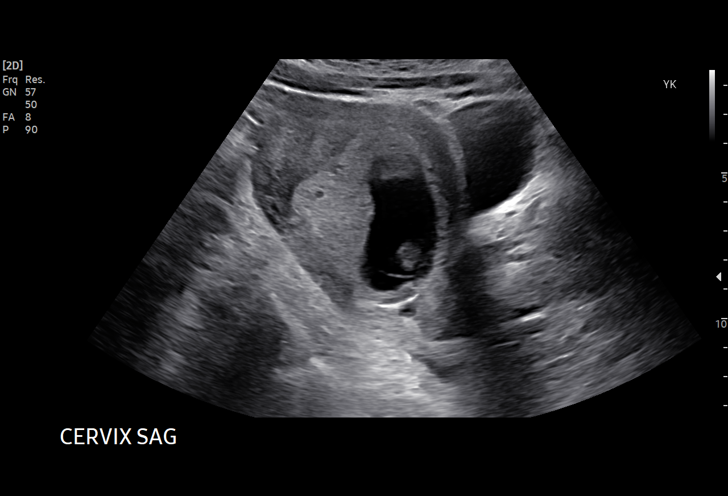
[im 30/57]
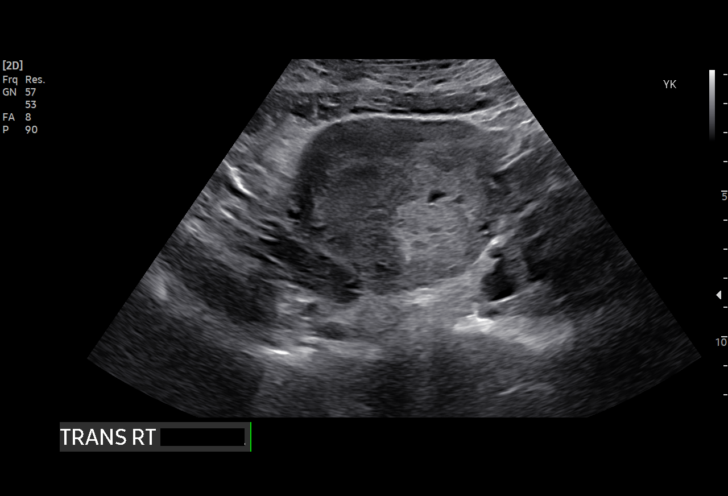
[im 32/57]
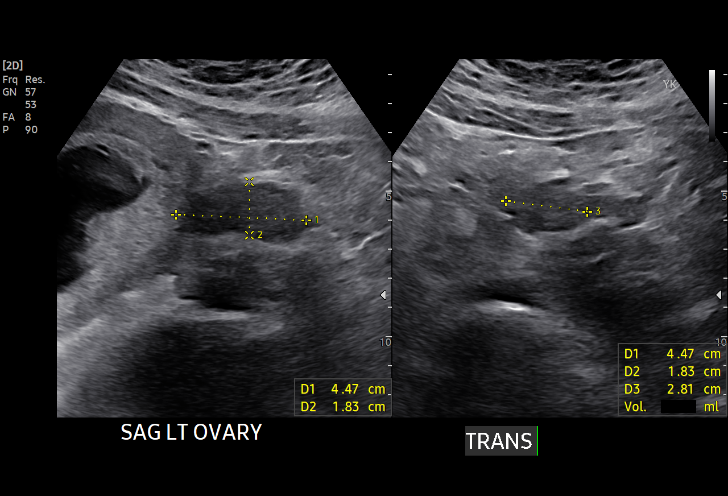
[im 36/57]
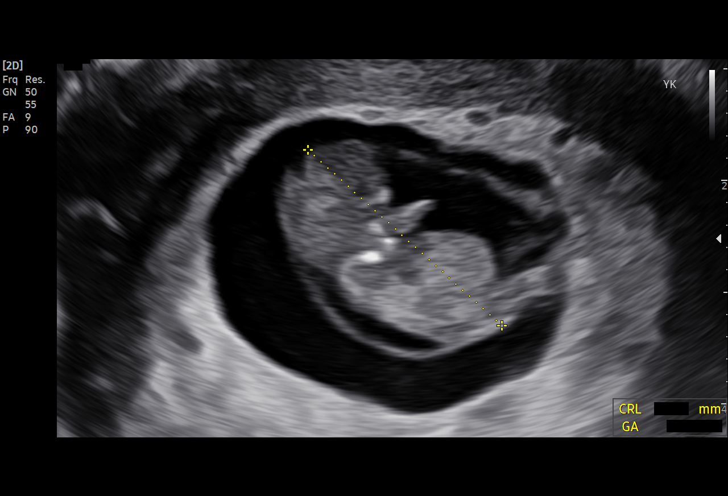
[im 40/57]
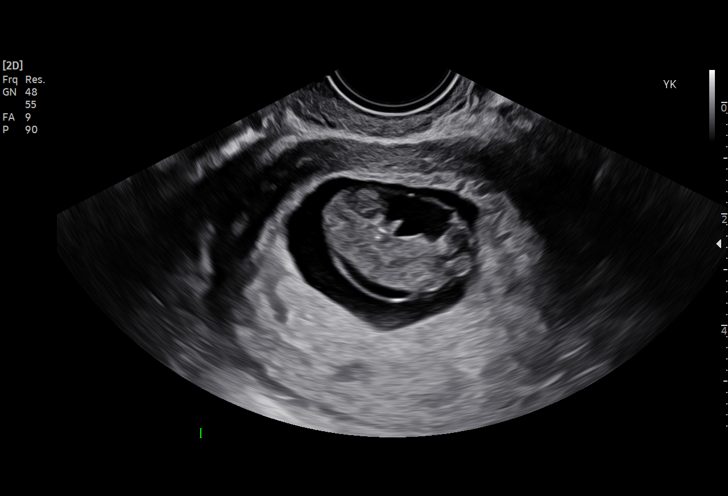
[im 44/57]
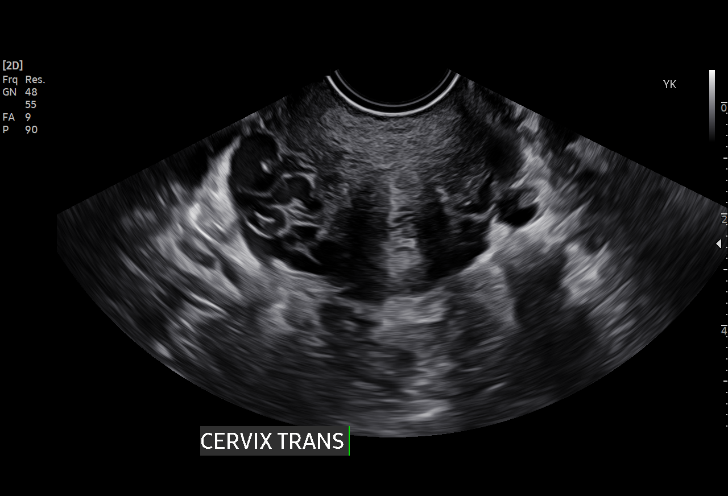
[im 48/57]
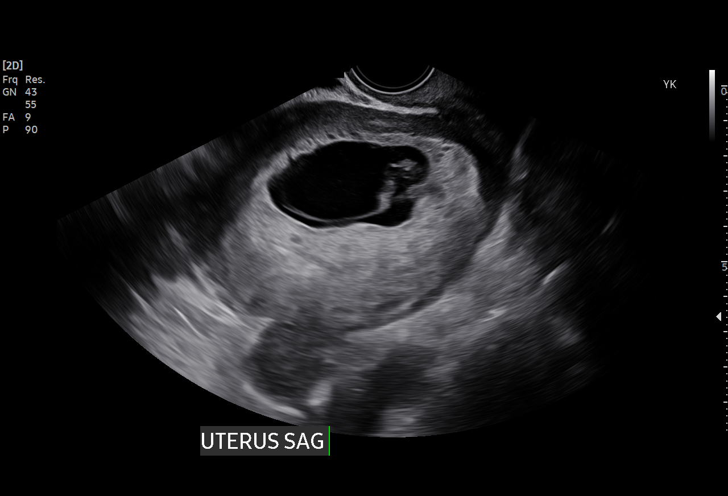
[im 52/57]
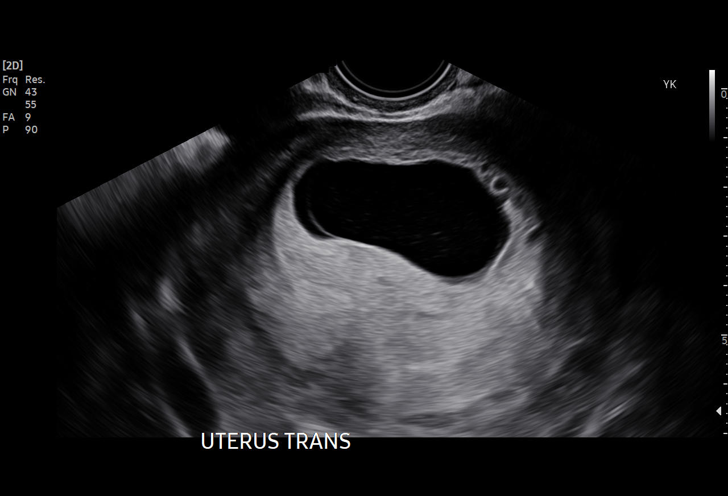
[im 57/57]
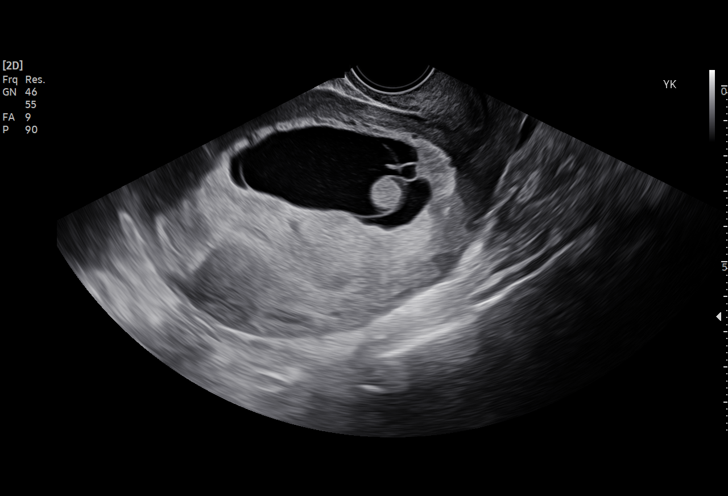

[15 of 28 positions shown; findings below may reference images not displayed]

FINDINGS: Intrauterine gestational sac: Single

Yolk sac:  Visualized

Embryo:  Visualized

Cardiac Activity: Not visualized

Heart Rate: N/A  bpm

CRL: 20 mm   8 w   4 d                  US EDC: 02/18/2021

Subchorionic hemorrhage:  None visualized.

Maternal uterus/adnexae: Maternal ovaries unremarkable. No free
fluid.
IMPRESSION: Single intrauterine gestational sac identified. Crown-rump length
estimates an 8 week 4 day gestational age although no fetal heart
activity evident on 2D grayscale or doppler assessment. Findings
meet definitive criteria for failed pregnancy. This follows SRU
consensus guidelines: Diagnostic Criteria for Nonviable Pregnancy
Early in the First Trimester. N Engl J Med 7659;[DATE].

These results will be called to the ordering clinician or
representative by the Radiologist Assistant, and communication
documented in the PACS or [REDACTED].

## 2022-06-06 ENCOUNTER — Emergency Department (HOSPITAL_COMMUNITY)
Admission: EM | Admit: 2022-06-06 | Discharge: 2022-06-06 | Disposition: A | Discharge status: Home / Self Care | Payer: Medicaid Other | Attending: Emergency Medicine | Admitting: Emergency Medicine

## 2022-06-06 ENCOUNTER — Ambulatory Visit (HOSPITAL_COMMUNITY)
Admission: EM | Admit: 2022-06-06 | Discharge: 2022-06-06 | Disposition: A | Discharge status: Home / Self Care | Payer: Medicaid Other

## 2022-06-06 ENCOUNTER — Encounter (HOSPITAL_COMMUNITY): Payer: Self-pay

## 2022-06-06 DIAGNOSIS — J020 Streptococcal pharyngitis: Secondary | ICD-10-CM | POA: Insufficient documentation

## 2022-06-06 DIAGNOSIS — Z1152 Encounter for screening for COVID-19: Secondary | ICD-10-CM | POA: Insufficient documentation

## 2022-06-06 DIAGNOSIS — J029 Acute pharyngitis, unspecified: Secondary | ICD-10-CM | POA: Diagnosis present

## 2022-06-06 DIAGNOSIS — J069 Acute upper respiratory infection, unspecified: Secondary | ICD-10-CM

## 2022-06-06 LAB — RESP PANEL BY RT-PCR (RSV, FLU A&B, COVID)  RVPGX2
Influenza A by PCR: NEGATIVE
Influenza B by PCR: NEGATIVE
Resp Syncytial Virus by PCR: NEGATIVE
SARS Coronavirus 2 by RT PCR: NEGATIVE

## 2022-06-06 LAB — GROUP A STREP BY PCR: Group A Strep by PCR: DETECTED — AB

## 2022-06-06 MED ORDER — PENICILLIN V POTASSIUM 500 MG PO TABS: 500.0000 mg | ORAL_TABLET | Freq: Three times a day (TID) | ORAL | 0 refills | Status: AC

## 2022-06-06 NOTE — ED Provider Notes (Signed)
[place] EMERGENCY DEPARTMENT AT [facility] Provider Note   CSN: [id number] Arrival date & time: 06/06/22  1957     History  Chief Complaint  Patient presents with   Sore Throat    Ashlee Burch is a 27 y.o. female, no pertinent past medical history, who presents to the ED secondary to sore throat, for the last 2 days as long as well as chills, cough, and runny nose.  She states that her infant has been sick, and tested positive for strep as well as she works in a daycare.  She notes that someone at work has COVID.  Denies any chest pain, shortness of breath.  No ear pain.     Home Medications Prior to Admission medications   Medication Sig Start Date End Date Taking? Authorizing Provider  penicillin v potassium (VEETID) 500 MG tablet Take 1 tablet (500 mg total) by mouth 3 (three) times daily. 06/06/22  Yes Karime Scheuermann L, PA  furosemide (LASIX) 20 MG tablet Take 1 tablet (20 mg total) by mouth daily. 09/11/21   Carlynn Herald, CNM  ibuprofen (ADVIL) 600 MG tablet Take 1 tablet (600 mg total) by mouth every 6 (six) hours. 09/04/21   Essie Hart, MD  NIFEdipine (ADALAT CC) 60 MG 24 hr tablet Take 1 tablet (60 mg total) by mouth daily. 09/05/21   Essie Hart, MD  oxyCODONE-acetaminophen (PERCOCET/ROXICET) 5-325 MG tablet Take 1 tablet by mouth every 4 (four) hours as needed for severe pain (pain scale 4-7). 09/04/21   Essie Hart, MD  Prenatal Vit-Fe Fumarate-FA (PRENATAL MULTIVITAMIN) TABS tablet Take 1 tablet by mouth daily at 12 noon.    [provider]  senna-docusate (SENOKOT-S) 8.6-50 MG tablet Take 2 tablets by mouth 2 (two) times daily as needed for mild constipation. 09/04/21   Essie Hart, MD      Allergies    Patient has no known allergies.    Review of Systems   Review of Systems  Constitutional:  Positive for chills.  Respiratory:  Negative for shortness of breath.     Physical Exam Updated Vital Signs BP (!) 149/100 (BP Location: Left  Arm)   Pulse 97   Temp 98.8 F (37.1 C) (Oral)   Resp 16   Ht  (1.626 m)   Wt 77.1 kg   LMP 06/06/2022   SpO2 99%   Breastfeeding No   BMI 29.18 kg/m  Physical Exam Vitals and nursing note reviewed.  Constitutional:      General: She is not in acute distress.    Appearance: She is well-developed.  HENT:     Head: Normocephalic and atraumatic.     Right Ear: Tympanic membrane normal.     Left Ear: Tympanic membrane normal.     Nose: No congestion.     Mouth/Throat:     Tonsils: Tonsillar exudate present. No tonsillar abscesses. 3+ on the right. 3+ on the left.  Eyes:     Conjunctiva/sclera: Conjunctivae normal.  Cardiovascular:     Rate and Rhythm: Normal rate and regular rhythm.     Heart sounds: No murmur heard. Pulmonary:     Effort: Pulmonary effort is normal. No respiratory distress.     Breath sounds: Normal breath sounds.  Abdominal:     Palpations: Abdomen is soft.     Tenderness: There is no abdominal tenderness.  Musculoskeletal:        General: No swelling.     Cervical back: Neck supple.  Skin:    General: Skin is warm and dry.     Capillary Refill: Capillary refill takes less than 2 seconds.  Neurological:     Mental Status: She is alert.  Psychiatric:        Mood and Affect: Mood normal.     ED Results / Procedures / Treatments   Labs (all labs ordered are listed, but only abnormal results are displayed) Labs Reviewed  GROUP A STREP BY PCR - Abnormal; Notable for the following components:      Result Value   Group A Strep by PCR DETECTED (*)    All other components within normal limits  RESP PANEL BY RT-PCR (RSV, FLU A&B, COVID)  [id number]    EKG None  Radiology No results found.  Procedures Procedures    Medications Ordered in ED Medications - No data to display  ED Course/ Medical Decision Making/ A&P                             Medical Decision Making Patient is a 27 year old female, here for viral symptoms as well as a  severe sore throat.  If it is positive for strep, we will test for strep, COVID and flu for further evaluation.  Symptoms have been going on for 2 days.  Amount and/or Complexity of Data Reviewed Labs:     Details: Strep positive Discussion of management or test interpretation with external provider(s): Discussed with patient she is strep positive, we will send her  penicillin to the pharmacy.  Additionally we discussed staying home for 2 days, to prevent spread.  Negative for COVID/flu/RSV.  Discussed with return precautions.  Discharged home with penicillin.  Risk Prescription drug management.    Final Clinical Impression(s) / ED Diagnoses Final diagnoses:  Strep throat  Viral upper respiratory tract infection    Rx / DC Orders ED Discharge Orders          Ordered    penicillin v potassium (VEETID) 500 MG tablet  3 times daily        06/06/22 2112              Prudie Guthridge, Harley [place], [place] 06/06/22 2114    Loetta Rough, MD 06/07/22 [phone]

## 2022-06-06 NOTE — ED Triage Notes (Signed)
Pt arrived POV for sore throat for 2 days reports "feels like glass" Pt also reports cough and runny nose. Infant has been sick and tested + for strep.

## 2022-06-06 NOTE — Discharge Instructions (Addendum)
Today you tested positive for strep, please follow-up with your primary care doctor as needed.  Additionally he should not be around anyone for the next 48 hours because you are at increased risk is running it.  Make sure you take the antibiotics as prescribed even if you start feeling better.  Additionally you are negative for COVID, your symptoms are likely secondary to a viral upper respiratory infection.

## 2022-09-21 ENCOUNTER — Encounter (HOSPITAL_COMMUNITY): Payer: Self-pay

## 2022-09-21 ENCOUNTER — Emergency Department (HOSPITAL_COMMUNITY)
Admission: EM | Admit: 2022-09-21 | Discharge: 2022-09-21 | Discharge status: Against Medical Advice | Payer: Medicaid Other | Attending: Emergency Medicine | Admitting: Emergency Medicine

## 2022-09-21 ENCOUNTER — Other Ambulatory Visit: Payer: Self-pay

## 2022-09-21 DIAGNOSIS — Z5321 Procedure and treatment not carried out due to patient leaving prior to being seen by health care provider: Secondary | ICD-10-CM | POA: Insufficient documentation

## 2022-09-21 DIAGNOSIS — R519 Headache, unspecified: Secondary | ICD-10-CM | POA: Insufficient documentation

## 2022-09-21 NOTE — ED Notes (Signed)
Pt called for in ED lobby for vitals reassessment, no answer x1. [employer]

## 2022-09-21 NOTE — ED Notes (Signed)
Pt called for in ED lobby for vitals reassessment, no answer x3. RN advised. [employer]

## 2022-09-21 NOTE — ED Triage Notes (Signed)
Intermittent headaches x 1 week. Has been taking tylenol/ ibuprofen with temporary improvement.

## 2022-09-21 NOTE — ED Notes (Signed)
Pt called for in ED lobby for vitals reassessment, no answer x2. [employer]

## 2023-01-28 ENCOUNTER — Emergency Department (HOSPITAL_COMMUNITY)
Admission: EM | Admit: 2023-01-28 | Discharge: 2023-01-28 | Discharge status: Against Medical Advice | Payer: Medicaid Other | Attending: Emergency Medicine | Admitting: Emergency Medicine

## 2023-01-28 ENCOUNTER — Other Ambulatory Visit: Payer: Self-pay

## 2023-01-28 ENCOUNTER — Encounter (HOSPITAL_COMMUNITY): Payer: Self-pay

## 2023-01-28 DIAGNOSIS — R1084 Generalized abdominal pain: Secondary | ICD-10-CM | POA: Diagnosis not present

## 2023-01-28 DIAGNOSIS — R197 Diarrhea, unspecified: Secondary | ICD-10-CM | POA: Insufficient documentation

## 2023-01-28 DIAGNOSIS — Z5321 Procedure and treatment not carried out due to patient leaving prior to being seen by health care provider: Secondary | ICD-10-CM | POA: Diagnosis not present

## 2023-01-28 DIAGNOSIS — R1110 Vomiting, unspecified: Secondary | ICD-10-CM | POA: Insufficient documentation

## 2023-01-28 LAB — CBC
HCT: 42.8 % (ref 36.0–46.0)
Hemoglobin: 14.4 g/dL (ref 12.0–15.0)
MCH: 28.1 pg (ref 26.0–34.0)
MCHC: 33.6 g/dL (ref 30.0–36.0)
MCV: 83.4 fL (ref 80.0–100.0)
Platelets: 282 10*3/uL (ref 150–400)
RBC: 5.13 MIL/uL — ABNORMAL HIGH (ref 3.87–5.11)
RDW: 13.8 % (ref 11.5–15.5)
WBC: 10.7 10*3/uL — ABNORMAL HIGH (ref 4.0–10.5)
nRBC: 0 % (ref 0.0–0.2)

## 2023-01-28 LAB — HCG, SERUM, QUALITATIVE: Preg, Serum: NEGATIVE

## 2023-01-28 LAB — COMPREHENSIVE METABOLIC PANEL
ALT: 67 U/L — ABNORMAL HIGH (ref 0–44)
AST: 45 U/L — ABNORMAL HIGH (ref 15–41)
Albumin: 3.7 g/dL (ref 3.5–5.0)
Alkaline Phosphatase: 80 U/L (ref 38–126)
Anion gap: 10 (ref 5–15)
BUN: 13 mg/dL (ref 6–20)
CO2: 22 mmol/L (ref 22–32)
Calcium: 8.9 mg/dL (ref 8.9–10.3)
Chloride: 103 mmol/L (ref 98–111)
Creatinine, Ser: 0.67 mg/dL (ref 0.44–1.00)
GFR, Estimated: >60 mL/min (ref >60)
Glucose, Bld: 97 mg/dL (ref 70–99)
Potassium: 3.4 mmol/L — ABNORMAL LOW (ref 3.5–5.1)
Sodium: 135 mmol/L (ref 135–145)
Total Bilirubin: 0.8 mg/dL (ref <1.2)
Total Protein: 7.7 g/dL (ref 6.5–8.1)

## 2023-01-28 LAB — URINALYSIS, ROUTINE W REFLEX MICROSCOPIC
Bacteria, UA: NONE SEEN
Bilirubin Urine: NEGATIVE
Glucose, UA: NEGATIVE mg/dL
Hgb urine dipstick: NEGATIVE
Ketones, ur: 20 mg/dL — AB
Nitrite: NEGATIVE
Protein, ur: 30 mg/dL — AB
Specific Gravity, Urine: 1.032 — ABNORMAL HIGH (ref 1.005–1.030)
pH: 5 (ref 5.0–8.0)

## 2023-01-28 LAB — LIPASE, BLOOD: Lipase: 23 U/L (ref 11–51)

## 2023-01-28 NOTE — ED Triage Notes (Signed)
Patient said she vomited yesterday 12 times along with diarrhea. Thinks she ate something bad. Generalized abdominal pain.

## 2023-01-28 NOTE — ED Notes (Signed)
No response in lobby to pt name twice for repeat vitals.
